# Patient Record
Sex: Male | Born: 1955 | Race: White | Hispanic: No | Marital: Married | Smoking: Current every day smoker
Health system: Southern US, Community
[De-identification: ages and names within clinical notes are randomized; demographics above are authoritative.]

## PROBLEM LIST (undated history)

## (undated) DIAGNOSIS — F32A Depression, unspecified: Secondary | ICD-10-CM

## (undated) DIAGNOSIS — L409 Psoriasis, unspecified: Secondary | ICD-10-CM

## (undated) DIAGNOSIS — N400 Enlarged prostate without lower urinary tract symptoms: Secondary | ICD-10-CM

## (undated) DIAGNOSIS — F329 Major depressive disorder, single episode, unspecified: Secondary | ICD-10-CM

## (undated) DIAGNOSIS — F419 Anxiety disorder, unspecified: Secondary | ICD-10-CM

## (undated) DIAGNOSIS — M48061 Spinal stenosis, lumbar region without neurogenic claudication: Secondary | ICD-10-CM

## (undated) DIAGNOSIS — K4090 Unilateral inguinal hernia, without obstruction or gangrene, not specified as recurrent: Secondary | ICD-10-CM

## (undated) DIAGNOSIS — G479 Sleep disorder, unspecified: Secondary | ICD-10-CM

## (undated) HISTORY — DX: Unilateral inguinal hernia, without obstruction or gangrene, not specified as recurrent: K40.90

## (undated) HISTORY — DX: Anxiety disorder, unspecified: F41.9

## (undated) HISTORY — PX: COLONOSCOPY: SHX174

## (undated) HISTORY — PX: KNEE ARTHROSCOPY: SUR90

## (undated) HISTORY — PX: ROTATOR CUFF REPAIR: SHX139

---

## 2012-05-18 ENCOUNTER — Emergency Department (HOSPITAL_BASED_OUTPATIENT_CLINIC_OR_DEPARTMENT_OTHER)
Admission: EM | Admit: 2012-05-18 | Discharge: 2012-05-18 | Disposition: A | Discharge status: Home / Self Care | Payer: BC Managed Care – PPO | Attending: Emergency Medicine | Admitting: Emergency Medicine

## 2012-05-18 DIAGNOSIS — K4090 Unilateral inguinal hernia, without obstruction or gangrene, not specified as recurrent: Secondary | ICD-10-CM

## 2012-05-18 LAB — BASIC METABOLIC PANEL
CO2: 24 mEq/L (ref 19–32)
Calcium: 9.9 mg/dL (ref 8.4–10.5)
GFR calc Af Amer: >90 mL/min (ref >90)
Sodium: 138 mEq/L (ref 135–145)

## 2012-05-18 LAB — DIFFERENTIAL
Basophils Absolute: 0 10*3/uL (ref 0.0–0.1)
Eosinophils Relative: 1 % (ref 0–5)
Lymphocytes Relative: 11 % — ABNORMAL LOW (ref 12–46)
Neutro Abs: 8.8 10*3/uL — ABNORMAL HIGH (ref 1.7–7.7)

## 2012-05-18 LAB — CBC
MCV: 87.3 fL (ref 78.0–100.0)
Platelets: 128 10*3/uL — ABNORMAL LOW (ref 150–400)
RDW: 13.5 % (ref 11.5–15.5)
WBC: 10.8 10*3/uL — ABNORMAL HIGH (ref 4.0–10.5)

## 2012-05-18 LAB — LACTIC ACID, PLASMA: Lactic Acid, Venous: 1.5 mmol/L (ref 0.5–2.2)

## 2012-05-18 MED ORDER — SODIUM CHLORIDE 0.9 % IV BOLUS (SEPSIS)
1000.0000 mL | Freq: Once | INTRAVENOUS | Status: AC
  Administered 2012-05-18: 1000 mL via INTRAVENOUS

## 2012-05-18 MED ORDER — OXYCODONE-ACETAMINOPHEN 5-325 MG PO TABS: ORAL_TABLET | ORAL | Status: DC

## 2012-05-18 MED ORDER — HYDROMORPHONE HCL PF 1 MG/ML IJ SOLN
1.0000 mg | Freq: Once | INTRAMUSCULAR | Status: AC
  Administered 2012-05-18: 1 mg via INTRAVENOUS
  Filled 2012-05-18: qty 1

## 2012-05-18 MED ORDER — MORPHINE SULFATE 4 MG/ML IJ SOLN
4.0000 mg | Freq: Once | INTRAMUSCULAR | Status: AC
  Administered 2012-05-18: 4 mg via INTRAVENOUS
  Filled 2012-05-18: qty 1

## 2012-05-18 MED ORDER — OXYCODONE-ACETAMINOPHEN 5-325 MG PO TABS
2.0000 | ORAL_TABLET | Freq: Once | ORAL | Status: AC
  Administered 2012-05-18: 2 via ORAL
  Filled 2012-05-18: qty 2

## 2012-05-18 MED ORDER — POLYETHYLENE GLYCOL 3350 17 GM/SCOOP PO POWD: 17.0000 g | Freq: Two times a day (BID) | ORAL | Status: DC | PRN

## 2012-05-18 NOTE — ED Provider Notes (Signed)
History     CSN: [id number]  Arrival date & time 05/18/12  1815   First MD Initiated Contact with Patient 05/18/12 1820      Chief Complaint  Patient presents with  . Groin Pain    (Consider location/radiation/quality/duration/timing/severity/associated sxs/prior treatment) HPI Patient is a 55 yo male who presents today complaining of 10 out of 10 left lower abdominal pain. Patient notable tenderness left inguinal region that he noted about 4 hours ago. Patient has been seen recently for similar symptoms. He reports a CT scan was performed at a hospital in [place] where they noted he had small inguinal hernia. Patient had his last bowel movement yesterday. He has had a colonoscopy recently and has noted some difficulty with constipation that is constant has significant straining. Over the past few days though patient has used numerous medications for this and does not feel that he is constipated at all at this point. Patient's bulge in the left inguinal area is tender to palpation. He denies any penile or scrotal pain. He denies any urinary symptoms. Patient denies nausea, vomiting, or fevers. He has not taken any medications for his pain. There are no other associated or modifying factors. No past medical history on file.  No past surgical history on file.  No family history on file.  History  Substance Use Topics  . Smoking status: Not on file  . Smokeless tobacco: Not on file  . Alcohol Use: Not on file      Review of Systems  Constitutional: Negative.   HENT: Negative.   Eyes: Negative.   Respiratory: Negative.   Cardiovascular: Negative.   Gastrointestinal: Positive for abdominal pain.  Genitourinary: Negative.   Musculoskeletal: Negative.   Skin: Negative.   Neurological: Negative.   Hematological: Negative.   Psychiatric/Behavioral: Negative.   All other systems reviewed and are negative.    Allergies  Aleve and Naproxen  Home Medications   Current  Outpatient Rx  Name Route Sig Dispense Refill  . ALPRAZOLAM 1 MG PO TABS Oral Take 1 mg by mouth at bedtime as needed. Patient uses this medication for anxiety.    [name] Oral Take 1 packet by mouth daily as needed. Patient used this medication for pain.    Marland Kitchen CLONAZEPAM 1 MG PO TABS Oral Take 1 mg by mouth 2 (two) times daily as needed.    . GLYBURIDE-METFORMIN 5-500 MG PO TABS Oral Take 1 tablet by mouth daily with breakfast.    . HYDROCODONE-ACETAMINOPHEN 5-500 MG PO TABS Oral Take 1 tablet by mouth every 6 (six) hours as needed. Patient uses this medication for pain.    Marland Kitchen METFORMIN HCL 500 MG PO TABS Oral Take 500 mg by mouth 2 (two) times daily with a meal.    . TAMSULOSIN HCL 0.4 MG PO CAPS Oral Take 0.4 mg by mouth daily.      BP 155/74  Pulse 97  Temp 98 F (36.7 C) (Oral)  Resp 18  Ht 6\' 3"  (1.905 m)  Wt 210 lb (95.255 kg)  BMI 26.25 kg/m2  SpO2 98%  Physical Exam  Nursing note and vitals reviewed. GEN: Well-developed, well-nourished male in no distress HEENT: Atraumatic, normocephalic. Oropharynx clear without erythema EYES: PERRLA BL, no scleral icterus. NECK: Trachea midline, no meningismus CV: regular rate and rhythm. No murmurs, rubs, or gallops PULM: No respiratory distress.  No crackles, wheezes, or rales. GI: soft, non-tender. No guarding, rebound, or tenderness. + bowel sounds  GU: Patient with  left inguinal bulge with palpation of left inguinal hernia on exam. This is worse with Valsalva. I cannot completely reduce this on my exam. No tenderness to palpation or masses appreciated in the scrotum. Normal circumcised male. Neuro: cranial nerves grossly 2-12 intact, no abnormalities of strength or sensation, A and O x 3 MSK: Patient moves all 4 extremities symmetrically, no deformity, edema, or injury noted Skin: No rashes petechiae, purpura, or jaundice Psych: no abnormality of mood   ED Course  Procedures (including critical care time)  Labs  Reviewed  CBC - Abnormal; Notable for the following:    WBC 10.8 (*)     Hemoglobin 17.1 (*)     Platelets 128 (*)     All other components within normal limits  DIFFERENTIAL - Abnormal; Notable for the following:    Neutrophils Relative 82 (*)     Neutro Abs 8.8 (*)     Lymphocytes Relative 11 (*)     All other components within normal limits  BASIC METABOLIC PANEL - Abnormal; Notable for the following:    Glucose, Bld 319 (*)     BUN 25 (*)     All other components within normal limits  LACTIC ACID, PLASMA   No results found.   1. Inguinal hernia       MDM  Patient presents today with left inguinal bulge consistent with hernia based on my exam. Patient had ice applied and was given pain medication. Patient had labs performed. Patient had imaging exam performed recently with CT of abdomen and pelvis with IV contrast at [facility]. Despite our efforts we were not able to obtain this result. Patient told me that it did show inguinal hernias. Patient did not have an elevation of his lactate which would suggest bowel necrosis. His hernia spontaneously reduced with ice and pain control. Patient did receive a dose of a gram of Dilaudid as well. Patient's pain was down to a 4/10. Patient was then given 2 tabs of Percocet by mouth. He was given a prescription for this medication and strongly advised that he would need to continue having regular bowel movements so as not to make his pain worse. Patient was given a prescription for MiraLAX and told to titrate this to a bowel movement at least every 2 days. He was given referral information for [place] [facility].  Patient was advised that if he has this happen again he can try ice and pain medications at home. If he is unable to resolve the bulge that he has at that time he should present to an emergency department for evaluation. Patient was completely reduced at the time of discharge. He was given a note for work.       Cyndra Numbers,  MD 05/18/12 2129

## 2012-05-18 NOTE — ED Notes (Signed)
Pt complaining of left groin pain for 2 months. States pain got worse this afternoon around 4pm and now he feels a knot in his left groin region.

## 2012-05-18 NOTE — ED Notes (Signed)
Pt reports left groin pain for 2-3 months. States pain got severe around 4pm this afternoon and he now feels a "knot" in his left groin region. Pt has tenderness and swelling to the left groin. Pt reports having gone to [facility] ER and was told they "didn't see anything" and that he needed to follow up with PCP for a colonoscoy. Pt then wen to [facility] and was told to come to [facility] for further evaluation. Pt rates pain 8/10.  Pt denies nausea and vomiting at this time.

## 2012-05-22 ENCOUNTER — Telehealth (INDEPENDENT_AMBULATORY_CARE_PROVIDER_SITE_OTHER): Payer: Self-pay | Admitting: General Surgery

## 2012-05-22 ENCOUNTER — Ambulatory Visit (INDEPENDENT_AMBULATORY_CARE_PROVIDER_SITE_OTHER): Payer: BC Managed Care – PPO | Admitting: General Surgery

## 2012-05-22 ENCOUNTER — Encounter (INDEPENDENT_AMBULATORY_CARE_PROVIDER_SITE_OTHER): Payer: Self-pay | Admitting: General Surgery

## 2012-05-22 VITALS — BP 120/76 | HR 98 | Temp 97.8°F | Resp 16 | Ht 75.0 in | Wt 203.0 lb

## 2012-05-22 DIAGNOSIS — K4090 Unilateral inguinal hernia, without obstruction or gangrene, not specified as recurrent: Secondary | ICD-10-CM

## 2012-05-22 MED ORDER — OXYCODONE-ACETAMINOPHEN 5-325 MG PO TABS: ORAL_TABLET | ORAL | Status: DC

## 2012-05-22 NOTE — Telephone Encounter (Signed)
Message copied by Liliana Cline on Tue May 22, 2012  3:05 PM ------      Message from: Cathi Roan      Created: Tue May 22, 2012  2:50 PM       [phone] Needs note faxed to working stating he will be out of work til after surgery. Please fax to [employer] [phone]

## 2012-05-22 NOTE — Progress Notes (Signed)
Patient ID: Derrick [name], male   DOB: 02/04/1956, 56 y.o.   MRN: [id number]  Chief Complaint  Patient presents with  . Inguinal Hernia    new pt- eval [facility]    HPI Derrick [name] is a 56 y.o. male.   HPI This is a 56-year-old Caucasian male referred by Dr. Hunt for evaluation of a left inguinal hernia. The patient states that he has had several months of left lower quadrant pain. He describes it as a sharp burning pain. It also feels like a pressure sensation. He has undergone several evaluations over the past couple months because of the persistent pain. It occurs on a daily basis. He states that he had a CT scan done at [facility] and was told he had a small left inguinal hernia. He also states that he has had a normal colonoscopy as well. This past Friday at work, he noticed a large bulge in his left groin which caused worse pain than usual. He went to the emergency room. It was reduced.he states that he has noticed a small lump in that area prior to Friday however it was the largest on Friday. He states he had another episode yesterday where he had a small lump in his groin but was able to reduce it by laying down. He states he vomited once yesterday. He is also had some nausea. He is passing gas. He denies any bloating or distention. He states that he has chronic irregular bowel movements. He denies any melena or hematochezia. He denies any dysuria. 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 Past Medical History  Diagnosis Date  . Inguinal hernia     left  . Anxiety     Past Surgical History  Procedure Date  . Rotator cuff repair     right x2  . Knee arthroscopy     right knee    Family History  Problem Relation Age of Onset  . Cancer Mother     breast and thyroid  . Cancer Father     prostate    Social History History  Substance Use Topics  . Smoking status: Current Everyday Smoker -- 1.0 packs/day  . Smokeless tobacco: Not on file  . Alcohol Use: No     Allergies  Allergen Reactions  . Aleve (Naproxen Sodium) Hives  . Naproxen Hives    Current Outpatient Prescriptions  Medication Sig Dispense Refill  . ALPRAZolam (XANAX) 1 MG tablet Take 1 mg by mouth at bedtime as needed. Patient uses this medication for anxiety.      . Aspirin-Acetaminophen-Caffeine (GOODY HEADACHE PO) Take 1 packet by mouth daily as needed. Patient used this medication for pain.      . clonazePAM (KLONOPIN) 1 MG tablet Take 1 mg by mouth 2 (two) times daily as needed.      . glyBURIDE-metformin (GLUCOVANCE) 5-500 MG per tablet Take 1 tablet by mouth daily with breakfast.      . HYDROcodone-acetaminophen (VICODIN) 5-500 MG per tablet Take 1 tablet by mouth every 6 (six) hours as needed. Patient uses this medication for pain.      . metFORMIN (GLUCOPHAGE) 500 MG tablet Take 500 mg by mouth 2 (two) times daily with a meal.      . ONE TOUCH ULTRA TEST test strip       . oxyCODONE-acetaminophen (PERCOCET) 5-325 MG per tablet Take 1-2 tabs by mouth every 6 hours as needed for pain.  30 tablet  0  . polyethylene glycol powder (GLYCOLAX/MIRALAX)   powder Take 17 g by mouth 2 (two) times daily as needed.      . Tamsulosin HCl (FLOMAX) 0.4 MG CAPS Take 0.4 mg by mouth daily.        Review of Systems Review of Systems  Constitutional: Positive for activity change. Negative for fever, chills, appetite change and unexpected weight change.  HENT: Negative for congestion and trouble swallowing.   Eyes: Negative for visual disturbance.  Respiratory: Negative for chest tightness and shortness of breath.   Cardiovascular: Negative for chest pain and leg swelling.       No PND, no orthopnea, no DOE  Gastrointestinal:       See HPI  Genitourinary: Negative for dysuria and hematuria.  Musculoskeletal: Negative.   Skin: Negative for rash.  Neurological: Negative for seizures and speech difficulty.  Hematological: Does not bruise/bleed easily.  Psychiatric/Behavioral: Negative  for behavioral problems and confusion.    Blood pressure 120/76, pulse 98, temperature 97.8 F (36.6 C), temperature source Temporal, resp. rate 16, height 6' 3" (1.905 m), weight 203 lb (92.08 kg).  Physical Exam Physical Exam  Vitals reviewed. Constitutional: He is oriented to person, place, and time. He appears well-developed and well-nourished. No distress.  HENT:  Head: Normocephalic and atraumatic.  Right Ear: External ear normal.  Left Ear: External ear normal.  Eyes: Conjunctivae are normal. No scleral icterus.  Neck: Normal range of motion. Neck supple. No tracheal deviation present. No thyromegaly present.  Cardiovascular: Normal rate, regular rhythm and normal heart sounds.   Pulmonary/Chest: Effort normal and breath sounds normal. No respiratory distress. He has no wheezes.       Smokers cough  Abdominal: Soft. Bowel sounds are normal. He exhibits no distension. There is no tenderness. A hernia is present. Hernia confirmed positive in the left inguinal area.  Genitourinary: Testes normal and penis normal. Right testis shows no mass. Left testis shows no mass.       Reducible [facility]  Musculoskeletal: Normal range of motion. He exhibits no edema and no tenderness.  Lymphadenopathy:    He has no cervical adenopathy.  Neurological: He is oriented to person, place, and time. He exhibits normal muscle tone.  Skin: Skin is warm and dry. No rash noted. He is not diaphoretic. No erythema.       Scattered hyperpigmented skin lesions on b/l LE  Psychiatric: He has a normal mood and affect. His behavior is normal. Judgment and thought content normal.    Data Reviewed ED note from 05/18/12  Assessment    Left inguinal hernia    Plan    We discussed the etiology of inguinal hernias. We discussed the signs & symptoms of incarceration & strangulation.  We discussed non-operative and operative management. We discussed both open and laparoscopic repairs. We discussed the pros and cons  of each along with the risk & benefits  The patient has elected to proceed with LAPAROSCOPIC REPAIR OF LEFT INGUINAL HERNIA WITH MESH   I described the procedure in detail.  The patient was given educational material. We discussed the risks and benefits including but not limited to bleeding, infection, chronic inguinal pain, nerve entrapment, hernia recurrence, mesh complications, hematoma formation, urinary retention, injury to the testicles, numbness in the groin, blood clots, injury to the surrounding structures, and anesthesia risk. We also discussed the typical post operative recovery course, including no heavy lifting for 4-6 weeks. I explained that the likelihood of improvement of their symptoms is good. I did explain that he is   at slightly higher risk for infection as well as recurrence given his tobacco use and underlying diabetes mellitus.  Zohan Shiflet M. Braidon Chermak, MD, FACS General, Bariatric, & Minimally Invasive Surgery [facility]         Tou Hayner M 05/22/2012, 5:18 PM    

## 2012-05-22 NOTE — Patient Instructions (Signed)
Hernia Repair with Laparoscope A hernia occurs when an internal organ pushes out through a weak spot in the belly (abdominal) wall muscles. Hernias most commonly occur in the groin and around the navel. Hernias can also occur through a cut by the surgeon (incision) after an abdominal operation. A hernia may be caused by:  Lifting heavy objects.   Prolonged coughing.   Straining to move your bowels.  Hernias can often be pushed back into place (reduced). Most hernias tend to get worse over time. Problems occur when abdominal contents get stuck in the opening and the blood supply is blocked or impaired (incarcerated hernia). Because of these risks, you require surgery to repair the hernia. Your hernia will be repaired using a laparoscope. Laparoscopic surgery is a type of minimally invasive surgery. It does not involve making a typical surgical cut (incision) in the skin. A laparoscope is a telescope-like rod and lens system. It is usually connected to a video camera and a light source so your caregiver can clearly see the operative area. The instruments are inserted through  to  inch (5 mm or 10 mm) openings in the skin at specific locations. A working and viewing space is created by blowing a small amount of carbon dioxide gas into the abdominal cavity. The abdomen is essentially blown up like a balloon (insufflated). This elevates the abdominal wall above the internal organs like a dome. The carbon dioxide gas is common to the human body and can be absorbed by tissue and removed by the respiratory system. Once the repair is completed, the small incisions will be closed with either stitches (sutures) or staples (just like a paper stapler only this staple holds the skin together). LET YOUR CAREGIVERS KNOW ABOUT:  Allergies.   Medications taken including herbs, eye drops, over the counter medications, and creams.   Use of steroids (by mouth or creams).   Previous problems with anesthetics or  Novocaine.   Possibility of pregnancy, if this applies.   History of blood clots (thrombophlebitis).   History of bleeding or blood problems.   Previous surgery.   Other health problems.  BEFORE THE PROCEDURE  Laparoscopy can be done either in a hospital or out-patient clinic. You may be given a mild sedative to help you relax before the procedure. Once in the operating room, you will be given a general anesthesia to make you sleep (unless you and your caregiver choose a different anesthetic).  AFTER THE PROCEDURE  After the procedure you will be watched in a recovery area. Depending on what type of hernia was repaired, you might be admitted to the hospital or you might go home the same day. With this procedure you may have less pain and scarring. This usually results in a quicker recovery and less risk of infection. HOME CARE INSTRUCTIONS   Bed rest is not required. You may continue your normal activities but avoid heavy lifting (more than 10 pounds) or straining.   Cough gently. If you are a smoker it is best to stop, as even the best hernia repair can break down with the continual strain of coughing.   Avoid driving until given the OK by your surgeon.   There are no dietary restrictions unless given otherwise.   TAKE ALL MEDICATIONS AS DIRECTED.   Only take over-the-counter or prescription medicines for pain, discomfort, or fever as directed by your caregiver.  SEEK MEDICAL CARE IF:   There is increasing abdominal pain or pain in your incisions.     There is more bleeding from incisions, other than minimal spotting.   You feel light headed or faint.   You develop an unexplained fever, chills, and/or an oral temperature above 102 F (38.9 C).   You have redness, swelling, or increasing pain in the wound.   Pus coming from wound.   A foul smell coming from the wound or dressings.  SEEK IMMEDIATE MEDICAL CARE IF:   You develop a rash.   You have difficulty breathing.    You have any allergic problems.  MAKE SURE YOU:   Understand these instructions.   Will watch your condition.   Will get help right away if you are not doing well or get worse.  Document Released: 10/31/2005 Document Revised: 10/20/2011 Document Reviewed: 09/30/2009 Exit[employer] Patient Information 2012 [employer]. 

## 2012-05-22 NOTE — Telephone Encounter (Signed)
Ok to keep patient out until after surgery per Dr Andrey Campanile. Note faxed.

## 2012-05-23 ENCOUNTER — Encounter (HOSPITAL_COMMUNITY): Payer: Self-pay | Admitting: Pharmacy Technician

## 2012-05-28 ENCOUNTER — Encounter (HOSPITAL_COMMUNITY): Payer: Self-pay

## 2012-05-28 ENCOUNTER — Ambulatory Visit (HOSPITAL_COMMUNITY)
Admission: RE | Admit: 2012-05-28 | Discharge: 2012-05-28 | Disposition: A | Discharge status: Home / Self Care | Payer: BC Managed Care – PPO | Source: Ambulatory Visit | Attending: General Surgery | Admitting: General Surgery

## 2012-05-28 ENCOUNTER — Encounter (HOSPITAL_COMMUNITY)
Admission: RE | Admit: 2012-05-28 | Discharge: 2012-05-28 | Disposition: A | Discharge status: Home / Self Care | Payer: BC Managed Care – PPO | Source: Ambulatory Visit | Attending: General Surgery | Admitting: General Surgery

## 2012-05-28 DIAGNOSIS — K4090 Unilateral inguinal hernia, without obstruction or gangrene, not specified as recurrent: Secondary | ICD-10-CM | POA: Insufficient documentation

## 2012-05-28 DIAGNOSIS — Z01818 Encounter for other preprocedural examination: Secondary | ICD-10-CM | POA: Insufficient documentation

## 2012-05-28 DIAGNOSIS — Z01812 Encounter for preprocedural laboratory examination: Secondary | ICD-10-CM | POA: Insufficient documentation

## 2012-05-28 LAB — CBC
Hemoglobin: 18.1 g/dL — ABNORMAL HIGH (ref 13.0–17.0)
Platelets: 120 10*3/uL — ABNORMAL LOW (ref 150–400)
RBC: 5.6 MIL/uL (ref 4.22–5.81)
WBC: 12.1 10*3/uL — ABNORMAL HIGH (ref 4.0–10.5)

## 2012-05-28 LAB — DIFFERENTIAL
Lymphocytes Relative: 10 % — ABNORMAL LOW (ref 12–46)
Lymphs Abs: 1.2 10*3/uL (ref 0.7–4.0)
Neutrophils Relative %: 85 % — ABNORMAL HIGH (ref 43–77)

## 2012-05-28 LAB — BASIC METABOLIC PANEL
CO2: 25 mEq/L (ref 19–32)
Calcium: 9.9 mg/dL (ref 8.4–10.5)
GFR calc non Af Amer: >90 mL/min (ref >90)
Potassium: 4.2 mEq/L (ref 3.5–5.1)
Sodium: 136 mEq/L (ref 135–145)

## 2012-05-28 LAB — SURGICAL PCR SCREEN: Staphylococcus aureus: POSITIVE — AB

## 2012-05-28 NOTE — Patient Instructions (Signed)
20 Chett Taniguchi  05/28/2012   Your procedure is scheduled on:  Tuesday 05/29/2012 at 0715 am  Report to [facility] at 0515 AM.  Call this number if you have problems the morning of surgery: [phone]   Remember:   Do not eat food:After Midnight.  May have clear liquids:until Midnight .    Take these medicines the morning of surgery with A SIP OF WATER: Klonopin   Do not wear jewelry  Do not wear lotions, powders, or perfumes.   Do not shave 48 hours prior to surgery. Men may shave face and neck.  Do not bring valuables to the hospital.  Contacts, dentures or bridgework may not be worn into surgery.  Leave suitcase in the car. After surgery it may be brought to your room.  For patients admitted to the hospital, checkout time is 11:00 AM the day of discharge.   Patients discharged the day of surgery will not be allowed to drive home.  Name and phone number of your driver: Ann-spouse [phone]  Special Instructions: CHG Shower Use Special Wash: 1/2 bottle night before surgery and 1/2 bottle morning of surgery.   Please read over the following fact sheets that you were given: MRSA Information, Sleep apnea Sheet, Incentive Spirometry sheet                 If you have any questions, please call me Telford Nab.Georgeanna Lea, RN,BSN at [phone]

## 2012-05-29 ENCOUNTER — Ambulatory Visit (HOSPITAL_COMMUNITY): Payer: BC Managed Care – PPO | Admitting: Anesthesiology

## 2012-05-29 ENCOUNTER — Encounter (HOSPITAL_COMMUNITY): Payer: Self-pay | Admitting: Anesthesiology

## 2012-05-29 ENCOUNTER — Encounter (HOSPITAL_COMMUNITY)
Admission: RE | Disposition: A | Discharge status: Home / Self Care | Payer: Self-pay | Source: Ambulatory Visit | Attending: General Surgery

## 2012-05-29 ENCOUNTER — Encounter (HOSPITAL_COMMUNITY): Payer: Self-pay | Admitting: *Deleted

## 2012-05-29 ENCOUNTER — Ambulatory Visit (HOSPITAL_COMMUNITY)
Admission: RE | Admit: 2012-05-29 | Discharge: 2012-05-29 | Disposition: A | Discharge status: Home / Self Care | Payer: BC Managed Care – PPO | Source: Ambulatory Visit | Attending: General Surgery | Admitting: General Surgery

## 2012-05-29 DIAGNOSIS — E119 Type 2 diabetes mellitus without complications: Secondary | ICD-10-CM | POA: Insufficient documentation

## 2012-05-29 DIAGNOSIS — J410 Simple chronic bronchitis: Secondary | ICD-10-CM | POA: Insufficient documentation

## 2012-05-29 DIAGNOSIS — Z01812 Encounter for preprocedural laboratory examination: Secondary | ICD-10-CM | POA: Insufficient documentation

## 2012-05-29 DIAGNOSIS — K4090 Unilateral inguinal hernia, without obstruction or gangrene, not specified as recurrent: Secondary | ICD-10-CM | POA: Insufficient documentation

## 2012-05-29 DIAGNOSIS — L989 Disorder of the skin and subcutaneous tissue, unspecified: Secondary | ICD-10-CM | POA: Insufficient documentation

## 2012-05-29 DIAGNOSIS — Z79899 Other long term (current) drug therapy: Secondary | ICD-10-CM | POA: Insufficient documentation

## 2012-05-29 HISTORY — PX: INGUINAL HERNIA REPAIR: SHX194

## 2012-05-29 HISTORY — PX: HERNIA REPAIR: SHX51

## 2012-05-29 LAB — GLUCOSE, CAPILLARY: Glucose-Capillary: 219 mg/dL — ABNORMAL HIGH (ref 70–99)

## 2012-05-29 SURGERY — REPAIR, HERNIA, INGUINAL, LAPAROSCOPIC
Anesthesia: General | Site: Abdomen | Laterality: Left | Wound class: Clean

## 2012-05-29 MED ORDER — CEFAZOLIN SODIUM-DEXTROSE 2-3 GM-% IV SOLR
INTRAVENOUS | Status: AC
  Filled 2012-05-29: qty 50

## 2012-05-29 MED ORDER — OXYCODONE HCL 5 MG PO TABS
ORAL_TABLET | ORAL | Status: AC
  Filled 2012-05-29: qty 2

## 2012-05-29 MED ORDER — ONDANSETRON HCL 4 MG/2ML IJ SOLN
INTRAMUSCULAR | Status: DC | PRN
  Administered 2012-05-29: 4 mg via INTRAVENOUS

## 2012-05-29 MED ORDER — ONDANSETRON HCL 4 MG/2ML IJ SOLN: 4.0000 mg | Freq: Four times a day (QID) | INTRAMUSCULAR | Status: DC | PRN

## 2012-05-29 MED ORDER — GLYCOPYRROLATE 0.2 MG/ML IJ SOLN
INTRAMUSCULAR | Status: DC | PRN
  Administered 2012-05-29: .6 mg via INTRAVENOUS

## 2012-05-29 MED ORDER — BUPIVACAINE-EPINEPHRINE PF 0.25-1:200000 % IJ SOLN
INTRAMUSCULAR | Status: AC
  Filled 2012-05-29: qty 30

## 2012-05-29 MED ORDER — LACTATED RINGERS IR SOLN
Status: DC | PRN
  Administered 2012-05-29: 1000 mL

## 2012-05-29 MED ORDER — LIDOCAINE HCL 4 % MT SOLN
OROMUCOSAL | Status: DC | PRN
  Administered 2012-05-29: 4 mL via TOPICAL

## 2012-05-29 MED ORDER — SODIUM CHLORIDE 0.9 % IJ SOLN: 3.0000 mL | INTRAMUSCULAR | Status: DC | PRN

## 2012-05-29 MED ORDER — LACTATED RINGERS IV SOLN
INTRAVENOUS | Status: DC | PRN
  Administered 2012-05-29 (×2): via INTRAVENOUS

## 2012-05-29 MED ORDER — MUPIROCIN 2 % EX OINT
TOPICAL_OINTMENT | CUTANEOUS | Status: AC
  Filled 2012-05-29: qty 22

## 2012-05-29 MED ORDER — SODIUM CHLORIDE 0.9 % IV SOLN: 250.0000 mL | INTRAVENOUS | Status: DC | PRN

## 2012-05-29 MED ORDER — HYDROMORPHONE HCL PF 1 MG/ML IJ SOLN
INTRAMUSCULAR | Status: AC
  Filled 2012-05-29: qty 1

## 2012-05-29 MED ORDER — NEOSTIGMINE METHYLSULFATE 1 MG/ML IJ SOLN
INTRAMUSCULAR | Status: DC | PRN
  Administered 2012-05-29: 4 mg via INTRAVENOUS

## 2012-05-29 MED ORDER — MIDAZOLAM HCL 5 MG/5ML IJ SOLN
INTRAMUSCULAR | Status: DC | PRN
  Administered 2012-05-29: 2 mg via INTRAVENOUS

## 2012-05-29 MED ORDER — OXYCODONE HCL 5 MG PO TABS
5.0000 mg | ORAL_TABLET | ORAL | Status: DC | PRN
  Administered 2012-05-29: 10 mg via ORAL

## 2012-05-29 MED ORDER — CISATRACURIUM BESYLATE (PF) 10 MG/5ML IV SOLN
INTRAVENOUS | Status: DC | PRN
  Administered 2012-05-29: 2 mg via INTRAVENOUS
  Administered 2012-05-29: 8 mg via INTRAVENOUS

## 2012-05-29 MED ORDER — ACETAMINOPHEN 10 MG/ML IV SOLN
INTRAVENOUS | Status: AC
  Filled 2012-05-29: qty 100

## 2012-05-29 MED ORDER — INSULIN ASPART 100 UNIT/ML ~~LOC~~ SOLN
SUBCUTANEOUS | Status: AC
  Filled 2012-05-29: qty 1

## 2012-05-29 MED ORDER — MORPHINE SULFATE 10 MG/ML IJ SOLN: 1.0000 mg | INTRAMUSCULAR | Status: DC | PRN

## 2012-05-29 MED ORDER — ACETAMINOPHEN 650 MG RE SUPP
650.0000 mg | RECTAL | Status: DC | PRN
  Filled 2012-05-29: qty 1

## 2012-05-29 MED ORDER — PROMETHAZINE HCL 25 MG/ML IJ SOLN: 6.2500 mg | INTRAMUSCULAR | Status: DC | PRN

## 2012-05-29 MED ORDER — ALBUTEROL SULFATE HFA 108 (90 BASE) MCG/ACT IN AERS
INHALATION_SPRAY | RESPIRATORY_TRACT | Status: DC | PRN
  Administered 2012-05-29: 7 via RESPIRATORY_TRACT

## 2012-05-29 MED ORDER — SUCCINYLCHOLINE CHLORIDE 20 MG/ML IJ SOLN
INTRAMUSCULAR | Status: DC | PRN
  Administered 2012-05-29: 100 mg via INTRAVENOUS

## 2012-05-29 MED ORDER — SUFENTANIL CITRATE 50 MCG/ML IV SOLN
INTRAVENOUS | Status: DC | PRN
  Administered 2012-05-29 (×2): 10 ug via INTRAVENOUS
  Administered 2012-05-29: 20 ug via INTRAVENOUS
  Administered 2012-05-29: 10 ug via INTRAVENOUS

## 2012-05-29 MED ORDER — MUPIROCIN 2 % EX OINT
TOPICAL_OINTMENT | Freq: Two times a day (BID) | CUTANEOUS | Status: DC
  Administered 2012-05-29: 1 via NASAL

## 2012-05-29 MED ORDER — OXYCODONE-ACETAMINOPHEN 7.5-325 MG PO TABS: 1.0000 | ORAL_TABLET | ORAL | Status: AC | PRN

## 2012-05-29 MED ORDER — ACETAMINOPHEN 325 MG PO TABS: 650.0000 mg | ORAL_TABLET | ORAL | Status: DC | PRN

## 2012-05-29 MED ORDER — SODIUM CHLORIDE 0.9 % IJ SOLN: 3.0000 mL | Freq: Two times a day (BID) | INTRAMUSCULAR | Status: DC

## 2012-05-29 MED ORDER — BUPIVACAINE-EPINEPHRINE 0.25% -1:200000 IJ SOLN
INTRAMUSCULAR | Status: DC | PRN
  Administered 2012-05-29: 30 mL

## 2012-05-29 MED ORDER — CEFAZOLIN SODIUM-DEXTROSE 2-3 GM-% IV SOLR
2.0000 g | INTRAVENOUS | Status: AC
  Administered 2012-05-29: 2 g via INTRAVENOUS

## 2012-05-29 MED ORDER — ACETAMINOPHEN 10 MG/ML IV SOLN
INTRAVENOUS | Status: DC | PRN
  Administered 2012-05-29: 1000 mg via INTRAVENOUS

## 2012-05-29 MED ORDER — INSULIN ASPART 100 UNIT/ML ~~LOC~~ SOLN
5.0000 [IU] | Freq: Once | SUBCUTANEOUS | Status: AC
  Administered 2012-05-29: 5 [IU] via SUBCUTANEOUS

## 2012-05-29 MED ORDER — MUPIROCIN 2 % EX OINT: TOPICAL_OINTMENT | Freq: Two times a day (BID) | CUTANEOUS | Status: AC

## 2012-05-29 MED ORDER — HYDROMORPHONE HCL PF 1 MG/ML IJ SOLN
0.2500 mg | INTRAMUSCULAR | Status: DC | PRN
  Administered 2012-05-29 (×4): 0.5 mg via INTRAVENOUS

## 2012-05-29 MED ORDER — PROPOFOL 10 MG/ML IV EMUL
INTRAVENOUS | Status: DC | PRN
  Administered 2012-05-29: 200 mg via INTRAVENOUS

## 2012-05-29 SURGICAL SUPPLY — 37 items
BANDAGE ADHESIVE 1X3 (GAUZE/BANDAGES/DRESSINGS) IMPLANT
BENZOIN TINCTURE PRP APPL 2/3 (GAUZE/BANDAGES/DRESSINGS) IMPLANT
CANISTER SUCTION 2500CC (MISCELLANEOUS) ×2 IMPLANT
CLOTH BEACON ORANGE TIMEOUT ST (SAFETY) ×2 IMPLANT
DECANTER SPIKE VIAL GLASS SM (MISCELLANEOUS) ×2 IMPLANT
DERMABOND ADVANCED (GAUZE/BANDAGES/DRESSINGS) ×1
DERMABOND ADVANCED .7 DNX12 (GAUZE/BANDAGES/DRESSINGS) ×1 IMPLANT
DEVICE SECURE STRAP 25 ABSORB (INSTRUMENTS) ×2 IMPLANT
DRAPE LAPAROSCOPIC ABDOMINAL (DRAPES) ×2 IMPLANT
DRAPE UTILITY XL STRL (DRAPES) ×2 IMPLANT
DRSG TEGADERM 2-3/8X2-3/4 SM (GAUZE/BANDAGES/DRESSINGS) IMPLANT
DRSG TEGADERM 4X4.75 (GAUZE/BANDAGES/DRESSINGS) IMPLANT
ELECT REM PT RETURN 9FT ADLT (ELECTROSURGICAL) ×2
ELECTRODE REM PT RTRN 9FT ADLT (ELECTROSURGICAL) ×1 IMPLANT
GLOVE BIO SURGEON STRL SZ7.5 (GLOVE) ×2 IMPLANT
GLOVE BIOGEL M STRL SZ7.5 (GLOVE) IMPLANT
GLOVE INDICATOR 8.0 STRL GRN (GLOVE) ×2 IMPLANT
GOWN STRL NON-REIN LRG LVL3 (GOWN DISPOSABLE) ×6 IMPLANT
GOWN STRL REIN XL XLG (GOWN DISPOSABLE) ×4 IMPLANT
KIT BASIN OR (CUSTOM PROCEDURE TRAY) ×2 IMPLANT
MESH ULTRAPRO 3X6 7.6X15CM (Mesh General) ×2 IMPLANT
NS IRRIG 1000ML POUR BTL (IV SOLUTION) ×2 IMPLANT
SCISSORS LAP 5X35 DISP (ENDOMECHANICALS) ×2 IMPLANT
SET IRRIG TUBING LAPAROSCOPIC (IRRIGATION / IRRIGATOR) ×2 IMPLANT
SHEARS CURVED HARMONIC AC 45CM (MISCELLANEOUS) IMPLANT
SLEEVE SURGEON STRL (DRAPES) ×2 IMPLANT
SOLUTION ANTI FOG 6CC (MISCELLANEOUS) ×2 IMPLANT
STRIP CLOSURE SKIN 1/2X4 (GAUZE/BANDAGES/DRESSINGS) IMPLANT
SUT MNCRL AB 4-0 PS2 18 (SUTURE) ×2 IMPLANT
SUT VICRYL 0 UR6 27IN ABS (SUTURE) ×4 IMPLANT
TOWEL OR 17X26 10 PK STRL BLUE (TOWEL DISPOSABLE) ×2 IMPLANT
TOWEL OR NON WOVEN STRL DISP B (DISPOSABLE) ×2 IMPLANT
TRAY FOLEY CATH 14FRSI W/METER (CATHETERS) ×2 IMPLANT
TRAY LAP CHOLE (CUSTOM PROCEDURE TRAY) ×2 IMPLANT
TROCAR BLADELESS OPT 5 75 (ENDOMECHANICALS) ×4 IMPLANT
TROCAR XCEL BLUNT TIP 100MML (ENDOMECHANICALS) ×2 IMPLANT
TUBING INSUFFLATION 10FT LAP (TUBING) ×2 IMPLANT

## 2012-05-29 NOTE — Interval H&P Note (Signed)
History and Physical Interval Note:  05/29/2012 7:07 AM  Artis Delay  has presented today for surgery, with the diagnosis of left ingunial hernia  The various methods of treatment have been discussed with the patient and family. After consideration of risks, benefits and other options for treatment, the patient has consented to  Procedure(s) (LRB): LAPAROSCOPIC INGUINAL HERNIA (Left) INSERTION OF MESH (Left) as a surgical intervention .  The patient's history has been reviewed, patient examined, no change in status, stable for surgery.  I have reviewed the patients' chart and labs.  Questions were answered to the patient's satisfaction.     Mary Sella. Andrey Campanile, MD, FACS General, Bariatric, & Minimally Invasive Surgery [facility] [place]  [facility] M

## 2012-05-29 NOTE — Anesthesia Postprocedure Evaluation (Signed)
  Anesthesia Post-op Note  Patient: Derrick [name]  Procedure(s) Performed: Procedure(s) (LRB): LAPAROSCOPIC INGUINAL HERNIA (Left) INSERTION OF MESH (Left)  Patient Location: PACU  Anesthesia Type: General  Level of Consciousness: awake and alert   Airway and Oxygen Therapy: Patient Spontanous Breathing  Post-op Pain: mild  Post-op Assessment: Post-op Vital signs reviewed, Patient's Cardiovascular Status Stable, Respiratory Function Stable, Patent Airway and No signs of Nausea or vomiting  Post-op Vital Signs: stable  Complications: No apparent anesthesia complications

## 2012-05-29 NOTE — Op Note (Signed)
05/29/2012  Derrick [name] 21-Feb-1956   PREOPERATIVE DIAGNOSIS: left inguinal hernia.   POSTOPERATIVE DIAGNOSIS: left indirect inguinal hernia.   PROCEDURE: Laparoscopic repair of left indirect inguinal hernia with  mesh (TAPP).   SURGEON: Mary Sella. Andrey Campanile, MD   ASSISTANT SURGEON: None.   ANESTHESIA: General plus local consisting of 0.25% Marcaine with epi.   ESTIMATED BLOOD LOSS: Minimal.   FINDINGS: The patient had a left indirect inguinal hernia.  It was repaired using a 3 inch x 6  inch piece of Ethicon UltraPro mesh.   SPECIMEN: none  INDICATIONS FOR PROCEDURE: 56 yo WM with several month history of left groin burning and stinging with a symptomatic inguinal hernia The risks and benefits including but not limited to bleeding, infection, chronic inguinal pain, nerve entrapment, hernia recurrence, mesh complications, hematoma formation, urinary retention, injury to the testicles or the ovaries, numbness in the groin, blood clots, injury to the surrounding structures, and anesthesia risk was discussed with the patient.  DESCRIPTION OF PROCEDURE: After obtaining verbal consent and marking  the left groin in the holding area with the patient confirming the  operative site, the patient was then taken back to the operating room, placed  supine on the operating room table. General endotracheal anesthesia was  established. The patient had emptied their bladder prior to going back to  the operating room. Sequential compression devices were placed. The  abdomen and groin were prepped and draped in the usual standard surgical  fashion with ChloraPrep. The patient received IV Tylenol as well as IV  antibiotics prior to the incision. A surgical time-out was performed.  Local was infiltrated at the base of the umbilicus.   Next, a 1-cm vertical infraumbilical incision was made with a #11 blade. The fascia  was grasped and lifted anteriorly. Next, the fascia was incised, and  the  abdominal cavity was entered. Pursestring suture was placed around  the fascial edges using a 0 Vicryl. A 12-mm Hasson trocar was placed.  Pneumoperitoneum was smoothly established up to a patient pressure of 15  mmHg. Laparoscope was advanced. There was no evidence of a  contralateral hernia. The patient had a defect lateral to  the inferior epigastric vessel, consistent with an left indirect  hernia. Two 5-mm trocars were placed, one on the right, one on the left  in the midclavicular line slightly above the level of the umbilicus all  under direct visualization. After local had been infiltrated, I then  made incision along the peritoneum on the left, starting 2 inches above  the anterior superior iliac spine and caring it medial  toward the median umbilical ligament in a lazy S configuration using  Endo Shears with electrocautery. The peritoneal flap was then gently  dissected downward from the anterior abdominal wall taking care not to  injure the inferior epigastric vessels. The pubic bone was identified.  The testicular vessels were identified.  Using  traction and counter traction with short graspers, I reduced the sac in  its entirety. Interestingly, there was a sigmoid colon epiploic appendage adhered down into the sac. This was freed with endoshears. The testicular vessels had been identified and preserved. The vas deferens was identified and preserved, and the hernia sac was stripped from those to  surrounding structures. The sac was tethered to the cord structures in 1 place but I was able to free it.  I then went about creating a large pocket by  lifting the peritoneum of the pelvic floor. I took great care  not to  injure the iliac vessels.    Local anesthetic was injected 2 finger breadths below and medial to the anterior superior iliac spine as well as along the left groin prior to placing the mesh. I then obtained a piece of Ethicon UltraPro mesh 3 inch x  6 inch, placed it  through the Hasson trocar, half of it covered medial  to the inferior epigastric vessels and half of it lateral to the  inferior epigastric vessels. The defect was well  covered with the mesh. I then secured the mesh to the abdominal wall  using an Ethicon secure strap tack. 2 Tacks were placed through  the Cooper's ligament, one tack on each side of the inferior epigastric  vessel and 1 tack out laterally. No tacks were placed below the  shelving edge of the inguinal ligament. Pneumoperitoneum was reduced  to 8 mmHg. I then brought the peritoneal flap back up to the abdominal  wall and tacked it to the abdominal wall using 4 tacks. There was no  defect in the peritoneum, and the mesh was well covered. I removed the  Hasson trocar and tied down the previously placed pursestring suture.  The closure was viewed laparoscopically. There was no evidence of  fascial defect. There was no air leak at the umbilicus. There was no  evidence of injury to surrounding structures. Pneumoperitoneum was  released, and the remaining trocars were removed. All skin incisions  were closed with a 4-0 Monocryl in a subcuticular fashion followed by  application of Dermabond. All needle, instrument, and sponge counts  were correct x2. There are no immediate complications. The patient  tolerated the procedure well. The patient was extubated and taken to the  recovery room in stable condition.  Mary Sella. Andrey Campanile, MD, FACS General, Bariatric, & Minimally Invasive Surgery [facility], [place]

## 2012-05-29 NOTE — Preoperative (Signed)
Beta Blockers   Reason not to administer Beta Blockers:Not Applicable 

## 2012-05-29 NOTE — Anesthesia Procedure Notes (Signed)
Procedure Name: Intubation Date/Time: 05/29/2012 7:24 AM Performed by: Leroy Libman L Patient Re-evaluated:Patient Re-evaluated prior to inductionOxygen Delivery Method: Circle system utilized Preoxygenation: Pre-oxygenation with 100% oxygen Intubation Type: IV induction Ventilation: Mask ventilation without difficulty and Oral airway inserted - appropriate to patient size Laryngoscope Size: Miller and 3 Grade View: Grade II Tube type: Oral Tube size: 8.0 mm Number of attempts: 1 Airway Equipment and Method: Stylet and LTA kit utilized Placement Confirmation: ETT inserted through vocal cords under direct vision,  breath sounds checked- equal and bilateral and positive ETCO2 Secured at: 22 cm Tube secured with: Tape Dental Injury: Teeth and Oropharynx as per pre-operative assessment

## 2012-05-29 NOTE — Progress Notes (Signed)
Notified Dr. Acey Lav of CBG 219 and also informed pt takes glucovance twice daily and it was last taken 05/28/12 at 2000.  Dr. Acey Lav gave no new orders.

## 2012-05-29 NOTE — Anesthesia Preprocedure Evaluation (Signed)
Anesthesia Evaluation  Patient identified by MRN, date of birth, ID band Patient awake    Reviewed: Allergy & Precautions, H&P , NPO status , Patient's Chart, lab work & pertinent test results  Airway Mallampati: II TM Distance: <3 FB Neck ROM: Full    Dental No notable dental hx.    Pulmonary Current Smoker,  breath sounds clear to auscultation  Pulmonary exam normal       Cardiovascular negative cardio ROS  Rhythm:Regular Rate:Normal     Neuro/Psych negative neurological ROS  negative psych ROS   GI/Hepatic negative GI ROS, Neg liver ROS,   Endo/Other  Type 2  Renal/GU negative Renal ROS  negative genitourinary   Musculoskeletal negative musculoskeletal ROS (+)   Abdominal   Peds negative pediatric ROS (+)  Hematology negative hematology ROS (+)   Anesthesia Other Findings   Reproductive/Obstetrics negative OB ROS                           Anesthesia Physical Anesthesia Plan  ASA: II  Anesthesia Plan: General   Post-op Pain Management:    Induction: Intravenous  Airway Management Planned: Oral ETT  Additional Equipment:   Intra-op Plan:   Post-operative Plan: Extubation in OR  Informed Consent: I have reviewed the patients History and Physical, chart, labs and discussed the procedure including the risks, benefits and alternatives for the proposed anesthesia with the patient or authorized representative who has indicated his/her understanding and acceptance.   Dental advisory given  Plan Discussed with: CRNA  Anesthesia Plan Comments:         Anesthesia Quick Evaluation

## 2012-05-29 NOTE — H&P (View-Only) (Signed)
Patient ID: Derrick [name], male   DOB: Nov 24, 1955, 56 y.o.   MRN: [id number]  Chief Complaint  Patient presents with  . Inguinal Hernia    new pt- eval [facility]    HPI Derrick [name] is a 56 y.o. male.   HPI This is a 56 year old Caucasian male referred by Dr. Alto Denver for evaluation of a left inguinal hernia. The patient states that he has had several months of left lower quadrant pain. He describes it as a sharp burning pain. It also feels like a pressure sensation. He has undergone several evaluations over the past couple months because of the persistent pain. It occurs on a daily basis. He states that he had a CT scan done at [facility] and was told he had a small left inguinal hernia. He also states that he has had a normal colonoscopy as well. This past Friday at work, he noticed a large bulge in his left groin which caused worse pain than usual. He went to the emergency room. It was reduced.he states that he has noticed a small lump in that area prior to Friday however it was the largest on Friday. He states he had another episode yesterday where he had a small lump in his groin but was able to reduce it by laying down. He states he vomited once yesterday. He is also had some nausea. He is passing gas. He denies any bloating or distention. He states that he has chronic irregular bowel movements. He denies any melena or hematochezia. He denies any dysuria. He works at a [employer]. Past Medical History  Diagnosis Date  . Inguinal hernia     left  . Anxiety     Past Surgical History  Procedure Date  . Rotator cuff repair     right x2  . Knee arthroscopy     right knee    Family History  Problem Relation Age of Onset  . Cancer Mother     breast and thyroid  . Cancer Father     prostate    Social History History  Substance Use Topics  . Smoking status: Current Everyday Smoker -- 1.0 packs/day  . Smokeless tobacco: Not on file  . Alcohol Use: No     Allergies  Allergen Reactions  . Aleve (Naproxen Sodium) Hives  . Naproxen Hives    Current Outpatient Prescriptions  Medication Sig Dispense Refill  . ALPRAZolam (XANAX) 1 MG tablet Take 1 mg by mouth at bedtime as needed. Patient uses this medication for anxiety.      . Aspirin-Acetaminophen-Caffeine (GOODY HEADACHE PO) Take 1 packet by mouth daily as needed. Patient used this medication for pain.      . clonazePAM (KLONOPIN) 1 MG tablet Take 1 mg by mouth 2 (two) times daily as needed.      . glyBURIDE-metformin (GLUCOVANCE) 5-500 MG per tablet Take 1 tablet by mouth daily with breakfast.      . HYDROcodone-acetaminophen (VICODIN) 5-500 MG per tablet Take 1 tablet by mouth every 6 (six) hours as needed. Patient uses this medication for pain.      . metFORMIN (GLUCOPHAGE) 500 MG tablet Take 500 mg by mouth 2 (two) times daily with a meal.      . ONE TOUCH ULTRA TEST test strip       . oxyCODONE-acetaminophen (PERCOCET) 5-325 MG per tablet Take 1-2 tabs by mouth every 6 hours as needed for pain.  30 tablet  0  . polyethylene glycol powder (GLYCOLAX/MIRALAX)  powder Take 17 g by mouth 2 (two) times daily as needed.      . Tamsulosin HCl (FLOMAX) 0.4 MG CAPS Take 0.4 mg by mouth daily.        Review of Systems Review of Systems  Constitutional: Positive for activity change. Negative for fever, chills, appetite change and unexpected weight change.  HENT: Negative for congestion and trouble swallowing.   Eyes: Negative for visual disturbance.  Respiratory: Negative for chest tightness and shortness of breath.   Cardiovascular: Negative for chest pain and leg swelling.       No PND, no orthopnea, no DOE  Gastrointestinal:       See HPI  Genitourinary: Negative for dysuria and hematuria.  Musculoskeletal: Negative.   Skin: Negative for rash.  Neurological: Negative for seizures and speech difficulty.  Hematological: Does not bruise/bleed easily.  Psychiatric/Behavioral: Negative  for behavioral problems and confusion.    Blood pressure 120/76, pulse 98, temperature 97.8 F (36.6 C), temperature source Temporal, resp. rate 16, height 6\' 3"  (1.905 m), weight 203 lb (92.08 kg).  Physical Exam Physical Exam  Vitals reviewed. Constitutional: He is oriented to person, place, and time. He appears well-developed and well-nourished. No distress.  HENT:  Head: Normocephalic and atraumatic.  Right Ear: External ear normal.  Left Ear: External ear normal.  Eyes: Conjunctivae are normal. No scleral icterus.  Neck: Normal range of motion. Neck supple. No tracheal deviation present. No thyromegaly present.  Cardiovascular: Normal rate, regular rhythm and normal heart sounds.   Pulmonary/Chest: Effort normal and breath sounds normal. No respiratory distress. He has no wheezes.       Smokers cough  Abdominal: Soft. Bowel sounds are normal. He exhibits no distension. There is no tenderness. A hernia is present. Hernia confirmed positive in the left inguinal area.  Genitourinary: Testes normal and penis normal. Right testis shows no mass. Left testis shows no mass.       Reducible LIH  Musculoskeletal: Normal range of motion. He exhibits no edema and no tenderness.  Lymphadenopathy:    He has no cervical adenopathy.  Neurological: He is oriented to person, place, and time. He exhibits normal muscle tone.  Skin: Skin is warm and dry. No rash noted. He is not diaphoretic. No erythema.       Scattered hyperpigmented skin lesions on b/l LE  Psychiatric: He has a normal mood and affect. His behavior is normal. Judgment and thought content normal.    Data Reviewed ED note from 05/18/12  Assessment    Left inguinal hernia    Plan    We discussed the etiology of inguinal hernias. We discussed the signs & symptoms of incarceration & strangulation.  We discussed non-operative and operative management. We discussed both open and laparoscopic repairs. We discussed the pros and cons  of each along with the risk & benefits  The patient has elected to proceed with LAPAROSCOPIC REPAIR OF LEFT INGUINAL HERNIA WITH MESH   I described the procedure in detail.  The patient was given educational material. We discussed the risks and benefits including but not limited to bleeding, infection, chronic inguinal pain, nerve entrapment, hernia recurrence, mesh complications, hematoma formation, urinary retention, injury to the testicles, numbness in the groin, blood clots, injury to the surrounding structures, and anesthesia risk. We also discussed the typical post operative recovery course, including no heavy lifting for 4-6 weeks. I explained that the likelihood of improvement of their symptoms is good. I did explain that he is  at slightly higher risk for infection as well as recurrence given his tobacco use and underlying diabetes mellitus.  Mary Sella. Andrey Campanile, MD, FACS General, Bariatric, & Minimally Invasive Surgery [facility], [place]         [facility] M 05/22/2012, 5:18 PM

## 2012-05-29 NOTE — Transfer of Care (Signed)
Immediate Anesthesia Transfer of Care Note  Patient: Derrick [name]  Procedure(s) Performed: Procedure(s) (LRB): LAPAROSCOPIC INGUINAL HERNIA (Left) INSERTION OF MESH (Left)  Patient Location: PACU  Anesthesia Type: General  Level of Consciousness: awake, alert  and oriented  Airway & Oxygen Therapy: Patient Spontanous Breathing and Patient connected to face mask oxygen  Post-op Assessment: Report given to PACU RN and Post -op Vital signs reviewed and stable  Post vital signs: Reviewed and stable  Complications: No apparent anesthesia complications

## 2012-05-30 ENCOUNTER — Encounter (HOSPITAL_COMMUNITY): Payer: Self-pay | Admitting: General Surgery

## 2012-06-04 ENCOUNTER — Telehealth (INDEPENDENT_AMBULATORY_CARE_PROVIDER_SITE_OTHER): Payer: Self-pay | Admitting: General Surgery

## 2012-06-04 NOTE — Telephone Encounter (Signed)
Pt's wife calling about swelling and discoloration following hernia surgery.  She states his scrotum and penis are "black and blue" and extremely swollen.  Also, he needs more pain medication.  Reassured wife that bruising and swelling are not unexpected.  Use ice packs, and elevate scrotum whenever he is sitting.  Use folded towel or small pillow to elevate.  Called in Hydrocodone 5/325 mg, # 30, 1-2 po Q 4-6 H prn pain, no refill (per standing orders) to [place], [place]:  [phone].

## 2012-06-13 ENCOUNTER — Encounter (INDEPENDENT_AMBULATORY_CARE_PROVIDER_SITE_OTHER): Payer: Self-pay | Admitting: General Surgery

## 2012-06-13 ENCOUNTER — Ambulatory Visit (INDEPENDENT_AMBULATORY_CARE_PROVIDER_SITE_OTHER): Payer: BC Managed Care – PPO | Admitting: General Surgery

## 2012-06-13 VITALS — BP 108/70 | HR 91 | Temp 99.2°F | Ht 75.0 in | Wt 207.0 lb

## 2012-06-13 DIAGNOSIS — Z09 Encounter for follow-up examination after completed treatment for conditions other than malignant neoplasm: Secondary | ICD-10-CM

## 2012-06-13 MED ORDER — OXYCODONE-ACETAMINOPHEN 7.5-325 MG PO TABS: 1.0000 | ORAL_TABLET | ORAL | Status: DC | PRN

## 2012-06-13 NOTE — Patient Instructions (Signed)
You have formed a hematoma (collection of old blood) in the groin after surgery. It will gradually get smaller but may take up to 2 months to go away

## 2012-06-14 NOTE — Progress Notes (Signed)
Subjective:     Patient ID: Derrick [name], male   DOB: March 05, 1956, 56 y.o.   MRN: [id number]  HPI  56 year old Caucasian male comes in for followup after undergoing a laparoscopic left indirect inguinal hernia repair with mesh on July 16. He states overall he is doing well. He denies any fevers or chills. He denies any nausea or vomiting. He reports daily bowel movements. He is still having some discomfort in his scrotal area. He states he developed swelling and bruising in his lower inguinal area  And  scrotal area. He denies any trouble urinating  Review of Systems     Objective:   Physical Exam BP 108/70  Pulse 91  Temp 99.2 F (37.3 C) (Temporal)  Ht 6\' 3"  (1.905 m)  Wt 207 lb (93.895 kg)  BMI 25.87 kg/m2  SpO2 95% Caucasian male appearing older than stated age in no apparent distress Abdomen-soft, nontender, nondistended. Well-healed trocar incisions GU-fair amount of bruising in the suprapubic area along with the left  And right scrotum.  he has a palpable hematoma in the left inguinal space extending into the upper scrotum. Both testicles are descended    Assessment:     Status post laparoscopic repair of left indirect inguinal hernia with mesh with postoperative hematoma    Plan:     I advised him he should continue to avoid from strenuous activity for an additional 4-5 weeks. I advised him he had developed a postoperative hematoma. We discussed that this is a possibility preop. I advised him that it should go down with time; however, it may take 2-3 months for it to completely resolve. He is not having any numbness or tingling in his groin. A followup 6-8 weeks was scheduled  Mary Sella. Andrey Campanile, MD, FACS General, Bariatric, & Minimally Invasive Surgery [facility], [place]

## 2012-06-28 ENCOUNTER — Encounter (INDEPENDENT_AMBULATORY_CARE_PROVIDER_SITE_OTHER): Payer: Self-pay | Admitting: General Surgery

## 2012-06-28 ENCOUNTER — Telehealth (INDEPENDENT_AMBULATORY_CARE_PROVIDER_SITE_OTHER): Payer: Self-pay | Admitting: General Surgery

## 2012-06-28 NOTE — Telephone Encounter (Signed)
Message copied by Liliana Cline on Thu Jun 28, 2012  9:52 AM ------      Message from: Zacarias Pontes      Created: Thu Jun 28, 2012  9:41 AM       PT NEEDS ANOTHER RTW FORM FOR HIS JOB.[employer] DOESN'T UNDERSTAND ONE THAT WAS SENT.PT SAID THAT IT NEEDS TO READ NO RESTRICTION AFTER 9/4????? THANKS JANICE PLS CALL HIM AT [phone]

## 2012-06-28 NOTE — Telephone Encounter (Signed)
Spoke with patient. New note written and faxed to his HR department at [phone].

## 2012-07-11 ENCOUNTER — Encounter (INDEPENDENT_AMBULATORY_CARE_PROVIDER_SITE_OTHER): Payer: Self-pay | Admitting: General Surgery

## 2012-07-11 ENCOUNTER — Telehealth (INDEPENDENT_AMBULATORY_CARE_PROVIDER_SITE_OTHER): Payer: Self-pay | Admitting: General Surgery

## 2012-07-11 NOTE — Telephone Encounter (Signed)
Patient requested extended time out of work until 08/01/12. Per Dr Andrey Campanile this is okay to extend out of work time. Note written. Faxed to employer per patient request. [facility] making patient aware this was sent.

## 2012-07-31 ENCOUNTER — Encounter (INDEPENDENT_AMBULATORY_CARE_PROVIDER_SITE_OTHER): Payer: BC Managed Care – PPO | Admitting: General Surgery

## 2013-03-07 NOTE — Progress Notes (Signed)
Need orders for 03-14-13 surgery, pre op is 03-13-13, thanks

## 2013-03-08 ENCOUNTER — Encounter (HOSPITAL_COMMUNITY): Payer: Self-pay | Admitting: Pharmacy Technician

## 2013-03-08 ENCOUNTER — Other Ambulatory Visit: Payer: Self-pay | Admitting: Orthopedic Surgery

## 2013-03-08 NOTE — Progress Notes (Signed)
Dr Shelle Iron-  Please add PRE OP ORDERS- has appt PST 03/13/13  THANK YOU

## 2013-03-13 ENCOUNTER — Ambulatory Visit (HOSPITAL_COMMUNITY)
Admission: RE | Admit: 2013-03-13 | Discharge: 2013-03-13 | Disposition: A | Discharge status: Home / Self Care | Payer: Worker's Compensation | Source: Ambulatory Visit | Attending: Orthopedic Surgery | Admitting: Orthopedic Surgery

## 2013-03-13 ENCOUNTER — Other Ambulatory Visit: Payer: Self-pay | Admitting: Orthopedic Surgery

## 2013-03-13 ENCOUNTER — Encounter (HOSPITAL_COMMUNITY): Payer: Self-pay

## 2013-03-13 ENCOUNTER — Encounter (HOSPITAL_COMMUNITY)
Admission: RE | Admit: 2013-03-13 | Discharge: 2013-03-13 | Disposition: A | Discharge status: Home / Self Care | Payer: Worker's Compensation | Source: Ambulatory Visit | Attending: Specialist | Admitting: Specialist

## 2013-03-13 DIAGNOSIS — Z01818 Encounter for other preprocedural examination: Secondary | ICD-10-CM | POA: Insufficient documentation

## 2013-03-13 DIAGNOSIS — M5137 Other intervertebral disc degeneration, lumbosacral region: Secondary | ICD-10-CM | POA: Insufficient documentation

## 2013-03-13 DIAGNOSIS — M51379 Other intervertebral disc degeneration, lumbosacral region without mention of lumbar back pain or lower extremity pain: Secondary | ICD-10-CM | POA: Insufficient documentation

## 2013-03-13 HISTORY — DX: Spinal stenosis, lumbar region without neurogenic claudication: M48.061

## 2013-03-13 HISTORY — DX: Psoriasis, unspecified: L40.9

## 2013-03-13 LAB — BASIC METABOLIC PANEL
CO2: 28 mEq/L (ref 19–32)
Chloride: 97 mEq/L (ref 96–112)
Creatinine, Ser: 0.52 mg/dL (ref 0.50–1.35)

## 2013-03-13 LAB — SURGICAL PCR SCREEN: Staphylococcus aureus: NEGATIVE

## 2013-03-13 LAB — CBC
HCT: 44.5 % (ref 39.0–52.0)
MCV: 86.6 fL (ref 78.0–100.0)
RBC: 5.14 MIL/uL (ref 4.22–5.81)
RDW: 12.5 % (ref 11.5–15.5)
WBC: 18.5 10*3/uL — ABNORMAL HIGH (ref 4.0–10.5)

## 2013-03-13 NOTE — H&P (Signed)
Derrick [name] is an 56 y.o. male.   Chief Complaint: back and right leg pain HPI: c/o back and right leg pain x 4.5 months s/p lifting injury at work. He has continued to have right lower extremity radicular pain that radiates occasionally down into his great toe. He was reporting some weakness associated with that. Hehas had his epidural which gave him some temporary relief, but now it has returned (ESI L4-5 right). Symptoms refractory to ESI, activity restrictions, opioid and non-opioid analgesics, NSAIDs, steroids,  Past Medical History  Diagnosis Date  . Diabetes mellitus   . Spinal stenosis, lumbar   . Psoriasis     Past Surgical History  Procedure Laterality Date  . Rotator cuff repair      right x2  . Knee arthroscopy      right knee  . Inguinal hernia repair  05/29/2012    Procedure: LAPAROSCOPIC INGUINAL HERNIA;  Surgeon: Eric M Wilson, MD,FACS;  Location: [facility];  Service: General;  Laterality: Left;  . Hernia repair  05/29/12    LIH    Family History  Problem Relation Age of Onset  . Cancer Mother     breast and thyroid  . Cancer Father     prostate   Social History:  reports that he has been smoking Cigarettes.  He has a 30 pack-year smoking history. He does not have any smokeless tobacco history on file. He reports that he does not drink alcohol or use illicit drugs.  Allergies:  Allergies  Allergen Reactions  . Aleve (Naproxen Sodium) Hives  . Naproxen Hives     (Not in a hospital admission)  Results for orders placed during the hospital encounter of 03/13/13 (from the past 48 hour(s))  SURGICAL PCR SCREEN     Status: None   Collection Time    03/13/13 12:58 PM      Result Value Range   MRSA, PCR NEGATIVE  NEGATIVE   Staphylococcus aureus NEGATIVE  NEGATIVE   Comment:            The Xpert SA Assay (FDA     approved for NASAL specimens     in patients over 21 years of age),     is one component of     a comprehensive surveillance     program.   Test performance has     been validated by [facility] for patients greater     than or equal to 1 year old.     It is not intended     to diagnose infection nor to     guide or monitor treatment.  BASIC METABOLIC PANEL     Status: Abnormal   Collection Time    03/13/13  1:30 PM      Result Value Range   Sodium 138  135 - 145 mEq/L   Potassium 4.2  3.5 - 5.1 mEq/L   Chloride 97  96 - 112 mEq/L   CO2 28  19 - 32 mEq/L   Glucose, Bld 286 (*) 70 - 99 mg/dL   BUN 14  6 - 23 mg/dL   Creatinine, Ser 0.52  0.50 - 1.35 mg/dL   Calcium 9.9  8.4 - 10.5 mg/dL   GFR calc non Af Amer >90  >90 mL/min   GFR calc Af Amer >90  >90 mL/min   Comment:            The eGFR has been calculated       using the CKD EPI equation.     This calculation has not been     validated in all clinical     situations.     eGFR's persistently     <90 mL/min signify     possible Chronic Kidney Disease.  CBC     Status: Abnormal   Collection Time    03/13/13  1:30 PM      Result Value Range   WBC 18.5 (*) 4.0 - 10.5 K/uL   RBC 5.14  4.22 - 5.81 MIL/uL   Hemoglobin 15.6  13.0 - 17.0 g/dL   HCT 44.5  39.0 - 52.0 %   MCV 86.6  78.0 - 100.0 fL   MCH 30.4  26.0 - 34.0 pg   MCHC 35.1  30.0 - 36.0 g/dL   RDW 12.5  11.5 - 15.5 %   Platelets 220  150 - 400 K/uL   Dg Lumbar Spine 2-3 Views  03/13/2013  *RADIOLOGY REPORT*  Clinical Data: Preop for spinal decompression.  LUMBAR SPINE - 2-3 VIEW  Comparison: None.  Findings: Normal alignment of the lumbar vertebral bodies.  Mild to moderate degenerative disc disease and facet disease.  No acute bony findings or destructive bony changes.  There are five non-rib bearing lumbar type vertebral bodies.  SI joint degenerative changes are noted.  IMPRESSION: Normal alignment and no acute bony findings Mild multilevel disc disease and facet disease.   Original Report Authenticated By: P. Gallerani, M.D.     Review of Systems  Constitutional: Negative.   HENT: Negative.    Eyes: Negative.   Respiratory: Negative.   Cardiovascular: Negative.   Gastrointestinal: Negative.   Genitourinary: Negative.   Musculoskeletal: Positive for back pain.  Skin: Negative.   Neurological: Positive for focal weakness.  Endo/Heme/Allergies: Negative.   Psychiatric/Behavioral: Negative.     There were no vitals taken for this visit. Physical Exam  Constitutional: He is oriented to person, place, and time. He appears well-developed and well-nourished. He appears distressed.  HENT:  Head: Normocephalic and atraumatic.  Eyes: Conjunctivae and EOM are normal. Pupils are equal, round, and reactive to light.  Neck: Normal range of motion. Neck supple.  Cardiovascular: Normal rate and regular rhythm.   Respiratory: Effort normal and breath sounds normal.  GI: Soft. Bowel sounds are normal.  Musculoskeletal:  On exam moderate distress. Mood and affect are appropriate. Walks with antalgic gait. Straight leg raise produces buttock, thigh, and calf pain on the right; minimal buttock pain on the left. EHL is 5-/5.  Lumbar spine exam reveals no evidence of soft tissue swelling, no evidence of soft tissue swelling or deformity or skin ecchymosis. On palpation there is no tenderness of the lumbar spine. No flank pain with percussion. The abdomen is soft and nontender. Nontender over the trochanters. No cellulitis or lymphadenopathy.  Motor is 5/5 including tibialis anterior, plantarflexion, quadriceps and hamstrings. The patient is normoreflexic. There is no Babinski or clonus. Sensory exam is intact to light touch. The patient has good distal pulses. No DVT. No pain and normal range of motion without instability of the hips, knees and ankles.  Neurological: He is alert and oriented to person, place, and time.  Skin: Skin is warm and dry.  Psychiatric: He has a normal mood and affect.     Three view radiographs demonstrate degenerative changes at 4-5 and 5-1 compensatory  scoliosis.  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 Also   facet hypertrophy with lateral recess stenosis. Minimal listhesis at 4-5. No instability flexion and extension.  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 Battiste concerning current pathology, relevant anatomy, and treatment options. At this point in time options are to live with his symptomatology with pain management. I would not recommend any further injections as the first was not therapeutic. On other option would be to perform lumbar decompression at 4-5 on the right with foraminotomy at 4-5, decompression the 4 root. He is four months status post his injury. We discussed the risks and benefits of that, in addition the postoperative course. Overnight in the hospital, two weeks until suture removal, physical therapy up until six weeks, and then at six weeks work conditioning program for four to six weeks to reach maximum medical improvement. Tobacco cessation is imperative prior to the procedure. We discussed in extensive detail and for long term indicating the dilatory side effects of that. He understands and can commit to that.  Dr. Beane discussed risks, complications, alternatives with the pt including but not limited to DVT, PE, infx, bleeding, failure of procedure, need for secondary procedure, CSF leak, dural tear, nerve injury, anesthesia risk, even death. All questions answered, pt desires to proceed.  Plan lumbar decompression L4-5 right  [place] Ice M. for Dr. Beane 03/13/2013, 9:44 PM    

## 2013-03-13 NOTE — Patient Instructions (Addendum)
Derrick [name]  03/13/2013                           YOUR PROCEDURE IS SCHEDULED ON: 03/14/13               PLEASE REPORT TO SHORT STAY CENTER AT : 8:00 AM               CALL THIS NUMBER IF ANY PROBLEMS THE DAY OF SURGERY :               [phone]                      REMEMBER:   Do not eat food or drink liquids AFTER MIDNIGHT   Take these medicines the morning of surgery with A SIP OF WATER: HYDROCODONE IF NEEDED   Do not wear jewelry, make-up   Do not wear lotions, powders, or perfumes.   Do not shave legs or underarms 12 hrs. before surgery (men may shave face)  Do not bring valuables to the hospital.  Contacts, dentures or bridgework may not be worn into surgery.  Leave suitcase in the car. After surgery it may be brought to your room.  For patients admitted to the hospital more than one night, checkout time is 11:00                          The day of discharge.   Patients discharged the day of surgery will not be allowed to drive home                             If going home same day of surgery, must have someone stay with you first                           24 hrs at home and arrange for some one to drive you home from hospital.    Special Instructions:   Please read over the following fact sheets that you were given:               1. MRSA  INFORMATION                      2. [facility] PREPARING FOR SURGERY SHEET               3. INCENTIVE SPIROMETER                                                X_____________________________________________________________________        Failure to follow these instructions may result in cancellation of your surgery

## 2013-03-13 NOTE — Progress Notes (Signed)
Abnormal CBC and CBG routed to Dr Shelle Iron.

## 2013-03-14 ENCOUNTER — Ambulatory Visit (HOSPITAL_COMMUNITY): Payer: Worker's Compensation

## 2013-03-14 ENCOUNTER — Encounter (HOSPITAL_COMMUNITY): Payer: Self-pay | Admitting: Anesthesiology

## 2013-03-14 ENCOUNTER — Ambulatory Visit (HOSPITAL_COMMUNITY)
Admission: RE | Admit: 2013-03-14 | Discharge: 2013-03-16 | Disposition: A | Discharge status: Home / Self Care | Payer: Worker's Compensation | Source: Ambulatory Visit | Attending: Specialist | Admitting: Specialist

## 2013-03-14 ENCOUNTER — Encounter (HOSPITAL_COMMUNITY)
Admission: RE | Disposition: A | Discharge status: Home / Self Care | Payer: Self-pay | Source: Ambulatory Visit | Attending: Specialist

## 2013-03-14 ENCOUNTER — Encounter (HOSPITAL_COMMUNITY): Payer: Self-pay

## 2013-03-14 ENCOUNTER — Ambulatory Visit (HOSPITAL_COMMUNITY): Payer: Worker's Compensation | Admitting: Anesthesiology

## 2013-03-14 DIAGNOSIS — F17200 Nicotine dependence, unspecified, uncomplicated: Secondary | ICD-10-CM | POA: Insufficient documentation

## 2013-03-14 DIAGNOSIS — M48062 Spinal stenosis, lumbar region with neurogenic claudication: Secondary | ICD-10-CM

## 2013-03-14 DIAGNOSIS — Z01812 Encounter for preprocedural laboratory examination: Secondary | ICD-10-CM | POA: Insufficient documentation

## 2013-03-14 DIAGNOSIS — E119 Type 2 diabetes mellitus without complications: Secondary | ICD-10-CM

## 2013-03-14 DIAGNOSIS — L408 Other psoriasis: Secondary | ICD-10-CM | POA: Insufficient documentation

## 2013-03-14 DIAGNOSIS — M5126 Other intervertebral disc displacement, lumbar region: Secondary | ICD-10-CM | POA: Insufficient documentation

## 2013-03-14 HISTORY — PX: LUMBAR LAMINECTOMY/DECOMPRESSION MICRODISCECTOMY: SHX5026

## 2013-03-14 LAB — GLUCOSE, CAPILLARY
Glucose-Capillary: 202 mg/dL — ABNORMAL HIGH (ref 70–99)
Glucose-Capillary: 193 mg/dL — ABNORMAL HIGH (ref 70–99)
Glucose-Capillary: 215 mg/dL — ABNORMAL HIGH (ref 70–99)
Glucose-Capillary: 273 mg/dL — ABNORMAL HIGH (ref 70–99)

## 2013-03-14 SURGERY — LUMBAR LAMINECTOMY/DECOMPRESSION MICRODISCECTOMY 1 LEVEL
Anesthesia: General | Laterality: Right | Wound class: Clean

## 2013-03-14 MED ORDER — HYDROMORPHONE HCL PF 1 MG/ML IJ SOLN
0.5000 mg | INTRAMUSCULAR | Status: DC | PRN
  Administered 2013-03-14 (×3): 1 mg via INTRAVENOUS
  Filled 2013-03-14 (×2): qty 1

## 2013-03-14 MED ORDER — GLYBURIDE 5 MG PO TABS
10.0000 mg | ORAL_TABLET | Freq: Every day | ORAL | Status: DC
  Administered 2013-03-15: 10 mg via ORAL
  Filled 2013-03-14 (×2): qty 2

## 2013-03-14 MED ORDER — ONDANSETRON HCL 4 MG/2ML IJ SOLN: 4.0000 mg | INTRAMUSCULAR | Status: DC | PRN

## 2013-03-14 MED ORDER — OXYCODONE HCL 5 MG PO TABS: 5.0000 mg | ORAL_TABLET | Freq: Once | ORAL | Status: DC | PRN

## 2013-03-14 MED ORDER — ACETAMINOPHEN 325 MG PO TABS: 650.0000 mg | ORAL_TABLET | ORAL | Status: DC | PRN

## 2013-03-14 MED ORDER — LABETALOL HCL 5 MG/ML IV SOLN
INTRAVENOUS | Status: DC | PRN
  Administered 2013-03-14: 5 mg via INTRAVENOUS

## 2013-03-14 MED ORDER — METFORMIN HCL 500 MG PO TABS
500.0000 mg | ORAL_TABLET | Freq: Every day | ORAL | Status: DC
  Administered 2013-03-15 – 2013-03-16 (×2): 500 mg via ORAL
  Filled 2013-03-14 (×3): qty 1

## 2013-03-14 MED ORDER — OXYCODONE-ACETAMINOPHEN 5-325 MG PO TABS: 1.0000 | ORAL_TABLET | ORAL | Status: DC | PRN

## 2013-03-14 MED ORDER — MENTHOL 3 MG MT LOZG: 1.0000 | LOZENGE | OROMUCOSAL | Status: DC | PRN

## 2013-03-14 MED ORDER — SODIUM CHLORIDE 0.9 % IJ SOLN
3.0000 mL | INTRAMUSCULAR | Status: DC | PRN
  Administered 2013-03-15: 3 mL via INTRAVENOUS

## 2013-03-14 MED ORDER — METFORMIN HCL 500 MG PO TABS
1000.0000 mg | ORAL_TABLET | Freq: Every day | ORAL | Status: DC
  Administered 2013-03-15: 1000 mg via ORAL
  Filled 2013-03-14 (×2): qty 2

## 2013-03-14 MED ORDER — METHOCARBAMOL 500 MG PO TABS: 500.0000 mg | ORAL_TABLET | Freq: Three times a day (TID) | ORAL | Status: DC

## 2013-03-14 MED ORDER — OXYCODONE-ACETAMINOPHEN 5-325 MG PO TABS
1.0000 | ORAL_TABLET | ORAL | Status: DC | PRN
  Administered 2013-03-15 – 2013-03-16 (×4): 2 via ORAL
  Filled 2013-03-14 (×4): qty 2

## 2013-03-14 MED ORDER — CEFAZOLIN SODIUM-DEXTROSE 2-3 GM-% IV SOLR
2.0000 g | INTRAVENOUS | Status: AC
  Administered 2013-03-14: 2 g via INTRAVENOUS
  Filled 2013-03-14: qty 50

## 2013-03-14 MED ORDER — HEMOSTATIC AGENTS (NO CHARGE) OPTIME
TOPICAL | Status: DC | PRN
  Administered 2013-03-14: 1 via TOPICAL

## 2013-03-14 MED ORDER — METHOCARBAMOL 500 MG PO TABS
500.0000 mg | ORAL_TABLET | Freq: Four times a day (QID) | ORAL | Status: DC | PRN
  Administered 2013-03-14 – 2013-03-16 (×7): 500 mg via ORAL
  Filled 2013-03-14 (×7): qty 1

## 2013-03-14 MED ORDER — SODIUM CHLORIDE 0.9 % IJ SOLN
3.0000 mL | Freq: Two times a day (BID) | INTRAMUSCULAR | Status: DC
  Administered 2013-03-15: 20:00:00 via INTRAVENOUS
  Administered 2013-03-15: 3 mL via INTRAVENOUS

## 2013-03-14 MED ORDER — GLYCOPYRROLATE 0.2 MG/ML IJ SOLN
INTRAMUSCULAR | Status: DC | PRN
  Administered 2013-03-14: .7 mg via INTRAVENOUS

## 2013-03-14 MED ORDER — SODIUM CHLORIDE 0.9 % IV SOLN: 250.0000 mL | INTRAVENOUS | Status: DC

## 2013-03-14 MED ORDER — ROCURONIUM BROMIDE 100 MG/10ML IV SOLN
INTRAVENOUS | Status: DC | PRN
  Administered 2013-03-14: 10 mg via INTRAVENOUS
  Administered 2013-03-14: 50 mg via INTRAVENOUS
  Administered 2013-03-14 (×3): 10 mg via INTRAVENOUS
  Administered 2013-03-14: 20 mg via INTRAVENOUS
  Administered 2013-03-14: 10 mg via INTRAVENOUS

## 2013-03-14 MED ORDER — LACTATED RINGERS IV SOLN
INTRAVENOUS | Status: DC
  Administered 2013-03-14: 09:00:00 via INTRAVENOUS

## 2013-03-14 MED ORDER — CEFAZOLIN SODIUM-DEXTROSE 2-3 GM-% IV SOLR
INTRAVENOUS | Status: AC
  Filled 2013-03-14: qty 50

## 2013-03-14 MED ORDER — LIDOCAINE HCL (CARDIAC) 20 MG/ML IV SOLN
INTRAVENOUS | Status: DC | PRN
  Administered 2013-03-14: 50 mg via INTRAVENOUS

## 2013-03-14 MED ORDER — GLYBURIDE-METFORMIN 5-500 MG PO TABS: 1.0000 | ORAL_TABLET | Freq: Two times a day (BID) | ORAL | Status: DC

## 2013-03-14 MED ORDER — SODIUM CHLORIDE 0.45 % IV SOLN
INTRAVENOUS | Status: AC
  Administered 2013-03-14: 15:00:00 via INTRAVENOUS

## 2013-03-14 MED ORDER — DOCUSATE SODIUM 100 MG PO CAPS
100.0000 mg | ORAL_CAPSULE | Freq: Two times a day (BID) | ORAL | Status: DC
  Administered 2013-03-14 – 2013-03-16 (×5): 100 mg via ORAL

## 2013-03-14 MED ORDER — NEOSTIGMINE METHYLSULFATE 1 MG/ML IJ SOLN
INTRAMUSCULAR | Status: DC | PRN
  Administered 2013-03-14: 4 mg via INTRAVENOUS

## 2013-03-14 MED ORDER — ACETAMINOPHEN 650 MG RE SUPP: 650.0000 mg | RECTAL | Status: DC | PRN

## 2013-03-14 MED ORDER — HYDROMORPHONE HCL PF 1 MG/ML IJ SOLN
INTRAMUSCULAR | Status: AC
  Administered 2013-03-14: 15:00:00
  Filled 2013-03-14: qty 1

## 2013-03-14 MED ORDER — SODIUM CHLORIDE 0.9 % IR SOLN
Status: DC | PRN
  Administered 2013-03-14: 11:00:00

## 2013-03-14 MED ORDER — HYDROMORPHONE HCL PF 1 MG/ML IJ SOLN
0.2500 mg | INTRAMUSCULAR | Status: DC | PRN
Start: 2013-03-14 — End: 2013-03-14

## 2013-03-14 MED ORDER — BUPIVACAINE-EPINEPHRINE (PF) 0.5% -1:200000 IJ SOLN
INTRAMUSCULAR | Status: AC
  Filled 2013-03-14: qty 10

## 2013-03-14 MED ORDER — BUPIVACAINE-EPINEPHRINE 0.5% -1:200000 IJ SOLN
INTRAMUSCULAR | Status: DC | PRN
  Administered 2013-03-14: 10 mL

## 2013-03-14 MED ORDER — GLYBURIDE 5 MG PO TABS
5.0000 mg | ORAL_TABLET | Freq: Every day | ORAL | Status: DC
  Administered 2013-03-15 – 2013-03-16 (×2): 5 mg via ORAL
  Filled 2013-03-14 (×3): qty 1

## 2013-03-14 MED ORDER — METHOCARBAMOL 100 MG/ML IJ SOLN: 500.0000 mg | Freq: Four times a day (QID) | INTRAVENOUS | Status: DC | PRN

## 2013-03-14 MED ORDER — CEFAZOLIN SODIUM-DEXTROSE 2-3 GM-% IV SOLR
2.0000 g | Freq: Three times a day (TID) | INTRAVENOUS | Status: AC
  Administered 2013-03-14 – 2013-03-15 (×3): 2 g via INTRAVENOUS
  Filled 2013-03-14 (×3): qty 50

## 2013-03-14 MED ORDER — THROMBIN 5000 UNITS EX SOLR
CUTANEOUS | Status: DC | PRN
  Administered 2013-03-14: 10000 [IU] via TOPICAL

## 2013-03-14 MED ORDER — HYDROCODONE-ACETAMINOPHEN 5-325 MG PO TABS
1.0000 | ORAL_TABLET | ORAL | Status: DC | PRN
  Administered 2013-03-14 – 2013-03-16 (×7): 2 via ORAL
  Filled 2013-03-14 (×7): qty 2

## 2013-03-14 MED ORDER — PROMETHAZINE HCL 25 MG/ML IJ SOLN: 6.2500 mg | INTRAMUSCULAR | Status: DC | PRN

## 2013-03-14 MED ORDER — ACETAMINOPHEN 10 MG/ML IV SOLN: 1000.0000 mg | Freq: Once | INTRAVENOUS | Status: DC | PRN

## 2013-03-14 MED ORDER — OXYCODONE HCL 5 MG/5ML PO SOLN
5.0000 mg | Freq: Once | ORAL | Status: DC | PRN
  Filled 2013-03-14: qty 5

## 2013-03-14 MED ORDER — FENTANYL CITRATE 0.05 MG/ML IJ SOLN
50.0000 ug | INTRAMUSCULAR | Status: DC | PRN
  Administered 2013-03-14: 50 ug via INTRAVENOUS

## 2013-03-14 MED ORDER — FENTANYL CITRATE 0.05 MG/ML IJ SOLN
INTRAMUSCULAR | Status: DC | PRN
  Administered 2013-03-14 (×4): 50 ug via INTRAVENOUS
  Administered 2013-03-14: 100 ug via INTRAVENOUS

## 2013-03-14 MED ORDER — THROMBIN 5000 UNITS EX SOLR
CUTANEOUS | Status: AC
  Filled 2013-03-14: qty 10000

## 2013-03-14 MED ORDER — MIDAZOLAM HCL 5 MG/5ML IJ SOLN
INTRAMUSCULAR | Status: DC | PRN
  Administered 2013-03-14: 2 mg via INTRAVENOUS

## 2013-03-14 MED ORDER — ONDANSETRON HCL 4 MG/2ML IJ SOLN
INTRAMUSCULAR | Status: DC | PRN
  Administered 2013-03-14: 4 mg via INTRAVENOUS

## 2013-03-14 MED ORDER — PHENOL 1.4 % MT LIQD: 1.0000 | OROMUCOSAL | Status: DC | PRN

## 2013-03-14 MED ORDER — MEPERIDINE HCL 50 MG/ML IJ SOLN: 6.2500 mg | INTRAMUSCULAR | Status: DC | PRN

## 2013-03-14 MED ORDER — HYDROMORPHONE HCL PF 1 MG/ML IJ SOLN
INTRAMUSCULAR | Status: DC | PRN
  Administered 2013-03-14 (×3): 1 mg via INTRAVENOUS

## 2013-03-14 MED ORDER — FENTANYL CITRATE 0.05 MG/ML IJ SOLN
INTRAMUSCULAR | Status: AC
  Filled 2013-03-14: qty 2

## 2013-03-14 MED ORDER — CHLORHEXIDINE GLUCONATE 4 % EX LIQD
60.0000 mL | Freq: Once | CUTANEOUS | Status: DC
  Filled 2013-03-14: qty 60

## 2013-03-14 MED ORDER — INSULIN ASPART 100 UNIT/ML ~~LOC~~ SOLN
0.0000 [IU] | Freq: Three times a day (TID) | SUBCUTANEOUS | Status: DC
  Administered 2013-03-14 – 2013-03-15 (×2): 5 [IU] via SUBCUTANEOUS
  Administered 2013-03-15: 3 [IU] via SUBCUTANEOUS
  Administered 2013-03-15 – 2013-03-16 (×2): 5 [IU] via SUBCUTANEOUS

## 2013-03-14 SURGICAL SUPPLY — 50 items
BAG ZIPLOCK 12X15 (MISCELLANEOUS) ×2 IMPLANT
BENZOIN TINCTURE PRP APPL 2/3 (GAUZE/BANDAGES/DRESSINGS) IMPLANT
CHLORAPREP W/TINT 26ML (MISCELLANEOUS) IMPLANT
CLEANER TIP ELECTROSURG 2X2 (MISCELLANEOUS) ×2 IMPLANT
CLOTH 2% CHLOROHEXIDINE 3PK (PERSONAL CARE ITEMS) ×2 IMPLANT
CLOTH BEACON ORANGE TIMEOUT ST (SAFETY) ×2 IMPLANT
DECANTER SPIKE VIAL GLASS SM (MISCELLANEOUS) ×2 IMPLANT
DRAPE MICROSCOPE LEICA (MISCELLANEOUS) ×2 IMPLANT
DRAPE POUCH INSTRU U-SHP 10X18 (DRAPES) ×2 IMPLANT
DRAPE SURG 17X11 SM STRL (DRAPES) ×2 IMPLANT
DRSG AQUACEL AG ADV 3.5X 6 (GAUZE/BANDAGES/DRESSINGS) ×2 IMPLANT
DRSG EMULSION OIL 3X3 NADH (GAUZE/BANDAGES/DRESSINGS) IMPLANT
DRSG PAD ABDOMINAL 8X10 ST (GAUZE/BANDAGES/DRESSINGS) IMPLANT
DRSG TELFA 4X5 ISLAND ADH (GAUZE/BANDAGES/DRESSINGS) IMPLANT
DURAPREP 26ML APPLICATOR (WOUND CARE) ×2 IMPLANT
ELECT BLADE TIP CTD 4 INCH (ELECTRODE) ×2 IMPLANT
ELECT REM PT RETURN 9FT ADLT (ELECTROSURGICAL) ×2
ELECTRODE REM PT RTRN 9FT ADLT (ELECTROSURGICAL) ×1 IMPLANT
GLOVE BIOGEL PI IND STRL 7.5 (GLOVE) ×1 IMPLANT
GLOVE BIOGEL PI IND STRL 8 (GLOVE) ×1 IMPLANT
GLOVE BIOGEL PI INDICATOR 7.5 (GLOVE) ×1
GLOVE BIOGEL PI INDICATOR 8 (GLOVE) ×1
GLOVE SURG SS PI 7.5 STRL IVOR (GLOVE) ×2 IMPLANT
GLOVE SURG SS PI 8.0 STRL IVOR (GLOVE) ×4 IMPLANT
GOWN PREVENTION PLUS LG XLONG (DISPOSABLE) ×2 IMPLANT
GOWN STRL REIN XL XLG (GOWN DISPOSABLE) ×4 IMPLANT
KIT BASIN OR (CUSTOM PROCEDURE TRAY) ×2 IMPLANT
KIT POSITIONING SURG ANDREWS (MISCELLANEOUS) ×2 IMPLANT
MANIFOLD NEPTUNE II (INSTRUMENTS) ×2 IMPLANT
NEEDLE SPNL 18GX3.5 QUINCKE PK (NEEDLE) ×4 IMPLANT
PATTIES SURGICAL .5 X.5 (GAUZE/BANDAGES/DRESSINGS) IMPLANT
PATTIES SURGICAL .75X.75 (GAUZE/BANDAGES/DRESSINGS) IMPLANT
PATTIES SURGICAL 1X1 (DISPOSABLE) IMPLANT
SPONGE SURGIFOAM ABS GEL 100 (HEMOSTASIS) ×2 IMPLANT
STAPLER VISISTAT (STAPLE) ×2 IMPLANT
STRIP CLOSURE SKIN 1/2X4 (GAUZE/BANDAGES/DRESSINGS) IMPLANT
SUT PROLENE 3 0 PS 2 (SUTURE) ×2 IMPLANT
SUT VIC AB 0 CT1 27 (SUTURE)
SUT VIC AB 0 CT1 27XBRD ANTBC (SUTURE) IMPLANT
SUT VIC AB 1 CT1 27 (SUTURE) ×1
SUT VIC AB 1 CT1 27XBRD ANTBC (SUTURE) ×1 IMPLANT
SUT VIC AB 1-0 CT2 27 (SUTURE) ×2 IMPLANT
SUT VIC AB 2-0 CT1 27 (SUTURE) ×1
SUT VIC AB 2-0 CT1 TAPERPNT 27 (SUTURE) ×1 IMPLANT
SUT VIC AB 2-0 CT2 27 (SUTURE) ×2 IMPLANT
SUT VICRYL 0 27 CT2 27 ABS (SUTURE) IMPLANT
SUT VICRYL 0 UR6 27IN ABS (SUTURE) IMPLANT
SYRINGE 10CC LL (SYRINGE) ×2 IMPLANT
TRAY LAMINECTOMY (CUSTOM PROCEDURE TRAY) ×2 IMPLANT
YANKAUER SUCT BULB TIP NO VENT (SUCTIONS) ×2 IMPLANT

## 2013-03-14 NOTE — Interval H&P Note (Signed)
History and Physical Interval Note:  03/14/2013 10:11 AM  Derrick [name]  has presented today for surgery, with the diagnosis of STENOSIS AND H&P L4-5  The various methods of treatment have been discussed with the patient and family. After consideration of risks, benefits and other options for treatment, the patient has consented to  Procedure(s): MICRO LUMBAR DECOMPRESSION L4-5 RIGHT POSSIBLE LEFT (Right) as a surgical intervention .  The patient's history has been reviewed, patient examined, no change in status, stable for surgery.  I have reviewed the patient's chart and labs.  Questions were answered to the patient's satisfaction.     Chelsa Stout C

## 2013-03-14 NOTE — Anesthesia Preprocedure Evaluation (Addendum)
Anesthesia Evaluation  Patient identified by MRN, date of birth, ID band Patient awake    Reviewed: Allergy & Precautions, H&P , NPO status , Patient's Chart, lab work & pertinent test results  Airway Mallampati: II TM Distance: <3 FB Neck ROM: Full    Dental no notable dental hx. (+) Dental Advisory Given   Pulmonary Current Smoker,  breath sounds clear to auscultation  Pulmonary exam normal       Cardiovascular negative cardio ROS  Rhythm:Regular Rate:Normal     Neuro/Psych negative neurological ROS  negative psych ROS   GI/Hepatic negative GI ROS, Neg liver ROS,   Endo/Other  diabetes, Type 2, Oral Hypoglycemic Agents  Renal/GU negative Renal ROS     Musculoskeletal negative musculoskeletal ROS (+)   Abdominal   Peds  Hematology negative hematology ROS (+)   Anesthesia Other Findings   Reproductive/Obstetrics                           Anesthesia Physical  Anesthesia Plan  ASA: II  Anesthesia Plan: General   Post-op Pain Management:    Induction: Intravenous  Airway Management Planned: Oral ETT  Additional Equipment:   Intra-op Plan:   Post-operative Plan: Extubation in OR  Informed Consent: I have reviewed the patients History and Physical, chart, labs and discussed the procedure including the risks, benefits and alternatives for the proposed anesthesia with the patient or authorized representative who has indicated his/her understanding and acceptance.   Dental advisory given  Plan Discussed with: CRNA  Anesthesia Plan Comments:         Anesthesia Quick Evaluation

## 2013-03-14 NOTE — H&P (View-Only) (Signed)
Derrick [name] is an 57 y.o. male.   Chief Complaint: back and right leg pain HPI: c/o back and right leg pain x 4.5 months s/p lifting injury at work. He has continued to have right lower extremity radicular pain that radiates occasionally down into his great toe. He was reporting some weakness associated with that. Hehas had his epidural which gave him some temporary relief, but now it has returned (ESI L4-5 right). Symptoms refractory to ESI, activity restrictions, opioid and non-opioid analgesics, NSAIDs, steroids,  Past Medical History  Diagnosis Date  . Diabetes mellitus   . Spinal stenosis, lumbar   . Psoriasis     Past Surgical History  Procedure Laterality Date  . Rotator cuff repair      right x2  . Knee arthroscopy      right knee  . Inguinal hernia repair  05/29/2012    Procedure: LAPAROSCOPIC INGUINAL HERNIA;  Surgeon: Atilano Ina, MD,FACS;  Location: [facility];  Service: General;  Laterality: Left;  . Hernia repair  05/29/12    LIH    Family History  Problem Relation Age of Onset  . Cancer Mother     breast and thyroid  . Cancer Father     prostate   Social History:  reports that he has been smoking Cigarettes.  He has a 30 pack-year smoking history. He does not have any smokeless tobacco history on file. He reports that he does not drink alcohol or use illicit drugs.  Allergies:  Allergies  Allergen Reactions  . Aleve (Naproxen Sodium) Hives  . Naproxen Hives     (Not in a hospital admission)  Results for orders placed during the hospital encounter of 03/13/13 (from the past 48 hour(s))  SURGICAL PCR SCREEN     Status: None   Collection Time    03/13/13 12:58 PM      Result Value Range   MRSA, PCR NEGATIVE  NEGATIVE   Staphylococcus aureus NEGATIVE  NEGATIVE   Comment:            The Xpert SA Assay (FDA     approved for NASAL specimens     in patients over 93 years of age),     is one component of     a comprehensive surveillance     program.   Test performance has     been validated by [employer] for patients greater     than or equal to 24 year old.     It is not intended     to diagnose infection nor to     guide or monitor treatment.  BASIC METABOLIC PANEL     Status: Abnormal   Collection Time    03/13/13  1:30 PM      Result Value Range   Sodium 138  135 - 145 mEq/L   Potassium 4.2  3.5 - 5.1 mEq/L   Chloride 97  96 - 112 mEq/L   CO2 28  19 - 32 mEq/L   Glucose, Bld 286 (*) 70 - 99 mg/dL   BUN 14  6 - 23 mg/dL   Creatinine, Ser 1.61  0.50 - 1.35 mg/dL   Calcium 9.9  8.4 - 09.6 mg/dL   GFR calc non Af Amer >90  >90 mL/min   GFR calc Af Amer >90  >90 mL/min   Comment:            The eGFR has been calculated  using the CKD EPI equation.     This calculation has not been     validated in all clinical     situations.     eGFR's persistently     <90 mL/min signify     possible Chronic Kidney Disease.  CBC     Status: Abnormal   Collection Time    03/13/13  1:30 PM      Result Value Range   WBC 18.5 (*) 4.0 - 10.5 K/uL   RBC 5.14  4.22 - 5.81 MIL/uL   Hemoglobin 15.6  13.0 - 17.0 g/dL   HCT 16.1  09.6 - 04.5 %   MCV 86.6  78.0 - 100.0 fL   MCH 30.4  26.0 - 34.0 pg   MCHC 35.1  30.0 - 36.0 g/dL   RDW 40.9  81.1 - 91.4 %   Platelets 220  150 - 400 K/uL   Dg Lumbar Spine 2-3 Views  03/13/2013  *RADIOLOGY REPORT*  Clinical Data: Preop for spinal decompression.  LUMBAR SPINE - 2-3 VIEW  Comparison: None.  Findings: Normal alignment of the lumbar vertebral bodies.  Mild to moderate degenerative disc disease and facet disease.  No acute bony findings or destructive bony changes.  There are five non-rib bearing lumbar type vertebral bodies.  SI joint degenerative changes are noted.  IMPRESSION: Normal alignment and no acute bony findings Mild multilevel disc disease and facet disease.   Original Report Authenticated By: Rudie Meyer, JohnstonD.     Review of Systems  Constitutional: Negative.   HENT: Negative.    Eyes: Negative.   Respiratory: Negative.   Cardiovascular: Negative.   Gastrointestinal: Negative.   Genitourinary: Negative.   Musculoskeletal: Positive for back pain.  Skin: Negative.   Neurological: Positive for focal weakness.  Endo/Heme/Allergies: Negative.   Psychiatric/Behavioral: Negative.     There were no vitals taken for this visit. Physical Exam  Constitutional: He is oriented to person, place, and time. He appears well-developed and well-nourished. He appears distressed.  HENT:  Head: Normocephalic and atraumatic.  Eyes: Conjunctivae and EOM are normal. Pupils are equal, round, and reactive to light.  Neck: Normal range of motion. Neck supple.  Cardiovascular: Normal rate and regular rhythm.   Respiratory: Effort normal and breath sounds normal.  GI: Soft. Bowel sounds are normal.  Musculoskeletal:  On exam moderate distress. Mood and affect are appropriate. Walks with antalgic gait. Straight leg raise produces buttock, thigh, and calf pain on the right; minimal buttock pain on the left. EHL is 5-/5.  Lumbar spine exam reveals no evidence of soft tissue swelling, no evidence of soft tissue swelling or deformity or skin ecchymosis. On palpation there is no tenderness of the lumbar spine. No flank pain with percussion. The abdomen is soft and nontender. Nontender over the trochanters. No cellulitis or lymphadenopathy.  Motor is 5/5 including tibialis anterior, plantarflexion, quadriceps and hamstrings. The patient is normoreflexic. There is no Babinski or clonus. Sensory exam is intact to light touch. The patient has good distal pulses. No DVT. No pain and normal range of motion without instability of the hips, knees and ankles.  Neurological: He is alert and oriented to person, place, and time.  Skin: Skin is warm and dry.  Psychiatric: He has a normal mood and affect.     Three view radiographs demonstrate degenerative changes at 4-5 and 5-1 compensatory  scoliosis.  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 Also  facet hypertrophy with lateral recess stenosis. Minimal listhesis at 4-5. No instability flexion and extension.  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 At this point in time options are to live with his symptomatology with pain management. I would not recommend any further injections as the first was not therapeutic. On other option would be to perform lumbar decompression at 4-5 on the right with foraminotomy at 4-5, decompression the 4 root. He is four months status post his injury. We discussed the risks and benefits of that, in addition the postoperative course. Overnight in the hospital, two weeks until suture removal, physical therapy up until six weeks, and then at six weeks work conditioning program for four to six weeks to reach maximum medical improvement. Tobacco cessation is imperative prior to the procedure. We discussed in extensive detail and for long term indicating the dilatory side effects of that. He understands and can commit to that.  Dr. Shelle Iron discussed risks, complications, alternatives with the pt including but not limited to DVT, PE, infx, bleeding, failure of procedure, need for secondary procedure, CSF leak, dural tear, nerve injury, anesthesia risk, even death. All questions answered, pt desires to proceed.  Plan lumbar decompression L4-5 right  Derrick Bailey M. for Dr. Shelle Iron 03/13/2013, 9:44 PM

## 2013-03-14 NOTE — Transfer of Care (Signed)
Immediate Anesthesia Transfer of Care Note  Patient: Derrick [name]  Procedure(s) Performed: Procedure(s) (LRB): MICRO LUMBAR DECOMPRESSION L4-5 RIGHT POSSIBLE LEFT (Right)  Patient Location: PACU  Anesthesia Type: General  Level of Consciousness: sedated, patient cooperative and responds to stimulaton  Airway & Oxygen Therapy: Patient Spontanous Breathing and Patient connected to face mask oxgen  Post-op Assessment: Report given to PACU RN and Post -op Vital signs reviewed and stable  Post vital signs: Reviewed and stable  Complications: No apparent anesthesia complications

## 2013-03-14 NOTE — Anesthesia Postprocedure Evaluation (Signed)
Anesthesia Post Note  Patient: Derrick [name]  Procedure(s) Performed: Procedure(s) (LRB): MICRO LUMBAR DECOMPRESSION L4-5 RIGHT POSSIBLE LEFT (Right)  Anesthesia type: General  Patient location: PACU  Post pain: Pain level controlled  Post assessment: Post-op Vital signs reviewed  Last Vitals: BP 145/80  Pulse 97  Temp(Src) 36.8 C (Oral)  Resp 18  Ht 6\' 3"  (1.905 m)  Wt 198 lb (89.812 kg)  BMI 24.75 kg/m2  SpO2 95%  Post vital signs: Reviewed  Level of consciousness: sedated  Complications: No apparent anesthesia complications

## 2013-03-14 NOTE — Brief Op Note (Signed)
03/14/2013  12:34 PM2  PATIENT:  Derrick Derrick [name]  58 y.o. male  PRE-OPERATIVE DIAGNOSIS:  STENOSIS AND H&P L4-5  POST-OPERATIVE DIAGNOSIS:  STENOSIS AND H&P L4-5  PROCEDURE:  Procedure(s): MICRO LUMBAR DECOMPRESSION L4-5 RIGHT POSSIBLE LEFT (Right)  SURGEON:  Surgeon(s) and Role:    * Javier Docker, MD - Primary  PHYSICIAN ASSISTANT:   ASSISTANTS: Dawayne Cirri ANESTHESIA:   general  EBL:  Total I/O In: 1000 [I.V.:1000] Out: -   BLOOD ADMINISTERED:none  DRAINS: none   LOCAL MEDICATIONS USED:  MARCAINE     SPECIMEN:  No Specimen  DISPOSITION OF SPECIMEN:  N/A  COUNTS:  YES  TOURNIQUET:  * No tourniquets in log *  DICTATION: .Other Dictation: Dictation Number [id number]  PLAN OF CARE: Admit to inpatient   PATIENT DISPOSITION:  PACU - hemodynamically stable.   Derrick [name] start of Pharmacological VTE agent (>24hrs) due to surgical blood loss or risk of bleeding: yes

## 2013-03-15 ENCOUNTER — Encounter (HOSPITAL_COMMUNITY): Payer: Self-pay | Admitting: Specialist

## 2013-03-15 DIAGNOSIS — E119 Type 2 diabetes mellitus without complications: Secondary | ICD-10-CM

## 2013-03-15 LAB — GLUCOSE, CAPILLARY
Glucose-Capillary: 213 mg/dL — ABNORMAL HIGH (ref 70–99)
Glucose-Capillary: 196 mg/dL — ABNORMAL HIGH (ref 70–99)
Glucose-Capillary: 225 mg/dL — ABNORMAL HIGH (ref 70–99)

## 2013-03-15 LAB — CBC
HCT: 38.9 % — ABNORMAL LOW (ref 39.0–52.0)
Hemoglobin: 13.1 g/dL (ref 13.0–17.0)
MCH: 28.8 pg (ref 26.0–34.0)
MCHC: 33.2 g/dL (ref 30.0–36.0)
MCV: 86.8 fL (ref 78.0–100.0)
Platelets: 178 K/uL (ref 150–400)
RBC: 4.48 MIL/uL (ref 4.22–5.81)
RDW: 12.7 % (ref 11.5–15.5)
WBC: 19.3 K/uL — ABNORMAL HIGH (ref 4.0–10.5)

## 2013-03-15 LAB — BASIC METABOLIC PANEL WITH GFR
BUN: 5 mg/dL — ABNORMAL LOW (ref 6–23)
CO2: 26 meq/L (ref 19–32)
Calcium: 8.7 mg/dL (ref 8.4–10.5)
Chloride: 95 meq/L — ABNORMAL LOW (ref 96–112)
Creatinine, Ser: 0.41 mg/dL — ABNORMAL LOW (ref 0.50–1.35)
GFR calc Af Amer: >90 mL/min
GFR calc non Af Amer: >90 mL/min
Glucose, Bld: 234 mg/dL — ABNORMAL HIGH (ref 70–99)
Potassium: 3.5 meq/L (ref 3.5–5.1)
Sodium: 134 meq/L — ABNORMAL LOW (ref 135–145)

## 2013-03-15 MED ORDER — TAMSULOSIN HCL 0.4 MG PO CAPS
0.4000 mg | ORAL_CAPSULE | Freq: Every day | ORAL | Status: DC
  Administered 2013-03-15 – 2013-03-16 (×2): 0.4 mg via ORAL
  Filled 2013-03-15 (×2): qty 1

## 2013-03-15 MED ORDER — GABAPENTIN 100 MG PO CAPS
100.0000 mg | ORAL_CAPSULE | Freq: Two times a day (BID) | ORAL | Status: DC
  Administered 2013-03-15 – 2013-03-16 (×3): 100 mg via ORAL
  Filled 2013-03-15 (×5): qty 1

## 2013-03-15 MED ORDER — GABAPENTIN 300 MG PO CAPS
300.0000 mg | ORAL_CAPSULE | Freq: Every day | ORAL | Status: DC
  Administered 2013-03-15: 300 mg via ORAL
  Filled 2013-03-15 (×2): qty 1

## 2013-03-15 MED ORDER — GABAPENTIN 100 MG PO CAPS: ORAL_CAPSULE | ORAL | Status: DC

## 2013-03-15 NOTE — Progress Notes (Signed)
CSW consulted for SNF placement. PN reviewed. Pt plans to d/c home later today or tomorrow am, depending on progress. RNCM will assist with d/c planning needs. CSW will assist if plan changes and SNF placement is needed.  Cori Razor LCSW [phone]

## 2013-03-15 NOTE — Care Management Note (Signed)
    Page 1 of 2   03/15/2013     3:15:59 PM   CARE MANAGEMENT NOTE 03/15/2013  Patient:  Derrick [name], Derrick [name]   Account Number:  [id number]  Date Initiated:  03/15/2013  Documentation initiated by:  Derrick [name]  Subjective/Objective Assessment:   dx spinal stenosis. HNP L4-5; lumbar decompression  L4-5    Worker's comp claim #[id number]  DOI-10/29/2012--Derrick [name] [employer]  Worker's comp CM-Derrick [name]     Action/Plan:   CM spoke with patient and spouse. Plans are for pt to return to his home in [facility] where his spouse will be caregiver. He owns standard walker   Anticipated DC Date:  03/15/2013   Anticipated DC Plan:  HOME/SELF CARE  In-house referral  Clinical Social Worker      DC Planning Services  CM consult      [facility] Choice  DURABLE MEDICAL EQUIPMENT   Choice offered to / List presented to:  [name] Patient   [name]  3-N-1  [name]      [name]  [name]        Status of service:  Completed, signed off Medicare Important Message given?   (If response is "NO", the following Medicare IM given date fields will be blank) Date Medicare IM given:   Date Additional Medicare IM given:    Discharge Disposition:    Per UR Regulation:  Reviewed for med. necessity/level of care/duration of stay  If discussed at Long Length of Stay Meetings, dates discussed:    Comments:  03/15/2013 Derrick Peru RN CCM [phone] Tct worker's comp CM-Derrick [name] to advise of [name]. CM advised to call [name] at [phone] to order DME. [name] called ; spoke with Derrick [name] at intake who advised CM to send face sheet, H&P, DME orders to fax#[phone]. Pt UX#[id number] for dme order. Confirmation received. [name]  called and advised that they would be delivered RW nd 3n1 after 3PM today to hospital.

## 2013-03-15 NOTE — Evaluation (Signed)
Occupational Therapy Evaluation Patient Details Name: Derrick [name] MRN: [id number] DOB: 07/18/1956 Today's Date: 03/15/2013 Time: 4098-1191 OT Time Calculation (min): 38 min  OT Assessment / Plan / Recommendation Clinical Impression   This 57 y.o. Male admitted for lumbar decompression L4-5.  Pt is limited by pain this pm, but is motivated to work with OT.  He requires min verbal cues to incorporate back precautions into ADLs.   He is unable to safely access LEs for LB ADLs without compromising back precautions, and therefore, will benefit from adaptive equipment for home use - (hip kit) Reacher, sock aid, LH shoe horn, LH sponge to allow him to perform LB ADLs modified independently.   He will benefit from continued OT to maximize safety and independence with BADLs    OT Assessment  Patient needs continued OT Services    Follow Up Recommendations  No OT follow up;Supervision - Intermittent    Barriers to Discharge None    Equipment Recommendations  3 in 1 bedside comode;Other (comment) ((hip kit) Reacher, sock aid, LH sponge, and LH shoe horn)    Recommendations for Other Services    Frequency  Min 2X/week    Precautions / Restrictions Precautions Precautions: Back Precaution Booklet Issued: Yes (comment) Precaution Comments: Pt able to state 3/3 precautions independently, but requires min A to adhere to them during activity Restrictions Weight Bearing Restrictions: No   Pertinent Vitals/Pain     ADL  Eating/Feeding: Independent Where Assessed - Eating/Feeding: Chair;Bed level Grooming: Wash/dry hands;Wash/dry face;Min guard Where Assessed - Grooming: Supported standing Upper Body Bathing: Supervision/safety Where Assessed - Upper Body Bathing: Supported sitting Lower Body Bathing: Moderate assistance Where Assessed - Lower Body Bathing: Supported sit to stand Upper Body Dressing: Supervision/safety Where Assessed - Upper Body Dressing: Unsupported sitting Lower Body  Dressing: Moderate assistance Where Assessed - Lower Body Dressing: Supported sit to Pharmacist, hospital: Minimal assistance Statistician Method: Sit to Barista: Comfort height toilet;Grab bars Toileting - Clothing Manipulation and Hygiene: Minimal assistance Where Assessed - Engineer, mining and Hygiene: Standing Equipment Used: Rolling walker;Sock aid;Reacher;Long-handled sponge;Long-handled shoe horn Transfers/Ambulation Related to ADLs: min guard assist with RW ADL Comments: Pt. unable to cross ankles over knees to access feet for LB ADLs.  Pt and wife were instructed in the use of AE for LB ADLs. He will need a reacher, sock aid, LH sponge, and LH shoe horn (hip kit) in order to complete LB bathing and dressing without compromising his surgery.    Also discussed use of 3-in1 commode in shower    OT Diagnosis: Generalized weakness;Acute pain  OT Problem List: Decreased strength;Pain;Decreased knowledge of use of DME or AE;Decreased knowledge of precautions OT Treatment Interventions: Self-care/ADL training;DME and/or AE instruction;Therapeutic activities;Patient/family education   OT Goals Acute Rehab OT Goals OT Goal Formulation: With patient/family Time For Goal Achievement: 03/22/13 Potential to Achieve Goals: Good ADL Goals Pt Will Perform Grooming: with supervision;Standing at sink ADL Goal: Grooming - Progress: Goal set today Pt Will Perform Lower Body Bathing: with supervision;Sit to stand from chair;Sit to stand from bed;with adaptive equipment ADL Goal: Lower Body Bathing - Progress: Goal set today Pt Will Perform Lower Body Dressing: with supervision;Sit to stand from chair;Sit to stand from bed;with adaptive equipment ADL Goal: Lower Body Dressing - Progress: Goal set today Pt Will Transfer to Toilet: with supervision;Ambulation;Comfort height toilet;Grab bars;Maintaining back safety precautions ADL Goal: Toilet Transfer -  Progress: Goal set today Pt Will Perform Toileting - Clothing Manipulation:  with supervision;Standing ADL Goal: Toileting - Clothing Manipulation - Progress: Goal set today Pt Will Perform Tub/Shower Transfer: with supervision;Tub transfer;Ambulation;with DME;Maintaining back safety precautions ADL Goal: Tub/Shower Transfer - Progress: Goal set today Additional ADL Goal #1: Pt will be independent with back precautions during all activity ADL Goal: Additional Goal #1 - Progress: Goal set today  Visit Information  Last OT Received On: 03/15/13 Assistance Needed: +1    Subjective Data  Subjective: "I'm just hurting" Patient Stated Goal: To get better   Prior Functioning     Home Living Lives With: Spouse Available Help at Discharge: Family Type of Home: House Home Access: Stairs to enter Secretary/administrator of Steps: 14 Entrance Stairs-Rails: Right Home Layout: Able to live on main level with bedroom/bathroom Bathroom Shower/Tub: Health visitor: Handicapped height Home Adaptive Equipment: Environmental consultant - standard;Straight cane Additional Comments: Level entry in from garage to basement and then 14 stairs to living level Prior Function Level of Independence: Independent Able to Take Stairs?: Yes Driving: Yes Vocation: Part time employment Communication Communication: No difficulties         Vision/Perception     Cognition  Cognition Arousal/Alertness: Awake/alert Behavior During Therapy: WFL for tasks assessed/performed Overall Cognitive Status: Within Functional Limits for tasks assessed    Extremity/Trunk Assessment Right Upper Extremity Assessment RUE ROM/Strength/Tone: WFL for tasks assessed RUE Coordination: WFL - gross/fine motor Left Upper Extremity Assessment LUE ROM/Strength/Tone: WFL for tasks assessed LUE Coordination: WFL - gross/fine motor     Mobility Bed Mobility Bed Mobility: Sit to Sidelying Left Supine to Sit: 4: Min guard Sit  to Supine: 4: Min guard Sit to Sidelying Left: 4: Min assist;With rail;HOB flat Details for Bed Mobility Assistance: Pt requires min cues for technique and precautions, and assist for LEs Transfers Transfers: Sit to Stand;Stand to Sit Sit to Stand: 4: Min guard;With upper extremity assist;From toilet;From chair/3-in-1 Stand to Sit: 4: Min guard;With upper extremity assist;To bed;To toilet Details for Transfer Assistance: verbal cues for UE technique and back precautions     Exercise     Balance     End of Session OT - End of Session Activity Tolerance: Patient limited by pain Patient left: in bed;with call bell/phone within reach;with family/visitor present Nurse Communication: Patient requests pain meds  GO Functional Limitation: Self care Self Care Current Status (Z6109): At least 20 percent but less than 40 percent impaired, limited or restricted Self Care Discharge Status [phone]): At least 1 percent but less than 20 percent impaired, limited or restricted   Laconda Basich M 03/15/2013, 3:42 PM

## 2013-03-15 NOTE — Progress Notes (Signed)
03/15/2013 Colleen Can BSN RN CCM [phone] DME has bben delivered to patient's room. OT has recommended hip kit/orders obtained and sent to [name] via fax for hip/kit to be sent to patient's home. Contact information for [employer] given to patient's spouse.

## 2013-03-15 NOTE — Progress Notes (Signed)
Subjective: 1 Day Post-Op Procedure(s) (LRB): MICRO LUMBAR DECOMPRESSION L4-5 RIGHT POSSIBLE LEFT (Right) Patient reports pain as moderate.  Slight improvement in leg pain. Requesting flomax Seen by myself and Dr. Shelle Iron  Objective: Vital signs in last 24 hours: Temp:  [97.5 F (36.4 C)-100.1 F (37.8 C)] 98.2 F (36.8 C) (05/02 0459) Pulse Rate:  [88-106] 88 (05/02 0459) Resp:  [9-18] 18 (05/02 0459) BP: (118-145)/(66-80) 128/69 mmHg (05/02 0459) SpO2:  [91 %-99 %] 96 % (05/02 0459) Weight:  [89.812 kg (198 lb)] 89.812 kg (198 lb) (05/01 1405)  Intake/Output from previous day: 05/01 0701 - 05/02 0700 In: 3140 [P.O.:840; I.V.:2200; IV Piggyback:100] Out: 1550 [Urine:1500; Blood:50] Intake/Output this shift:     Recent Labs  03/13/13 1330 03/15/13 0400  HGB 15.6 13.1    Recent Labs  03/13/13 1330 03/15/13 0400  WBC 18.5* 19.3*  RBC 5.14 4.48  HCT 44.5 38.9*  PLT 220 178    Recent Labs  03/13/13 1330 03/15/13 0400  NA 138 134*  K 4.2 3.5  CL 97 95*  CO2 28 26  BUN 14 5*  CREATININE 0.52 0.41*  GLUCOSE 286* 234*  CALCIUM 9.9 8.7   No results found for this basename: LABPT, INR,  in the last 72 hours  Neurologically intact ABD soft Neurovascular intact Sensation intact distally Intact pulses distally Dorsiflexion/Plantar flexion intact Incision: dressing C/D/I and no drainage No cellulitis present Compartment soft  Assessment/Plan: 1 Day Post-Op Procedure(s) (LRB): MICRO LUMBAR DECOMPRESSION L4-5 RIGHT POSSIBLE LEFT (Right) Advance diet Up with therapy Will add Flomax Will restart Neurontin 100mg  BID (breakfast and lunch) and 300mg  QHS, will titrate up D/C later today after PT vs. Tomorrow AM  Derrick [name] M. 03/15/2013, 7:33 AM

## 2013-03-15 NOTE — Progress Notes (Signed)
Inpatient Diabetes Program Recommendations  AACE/ADA: New Consensus Statement on Inpatient Glycemic Control (2013)  Target Ranges:  Prepandial:   less than 140 mg/dL      Peak postprandial:   less than 180 mg/dL (1-2 hours)      Critically ill patients:  140 - 180 mg/dL   Hyperglycemia in 191 range  Inpatient Diabetes Program Recommendations Insulin - Basal: while here, add basal lantus of 10 units Insulin - Meal Coverage: Please add 4 units meal coverage rather than using glipiizide Oral Agents: Best not to use oral agents while in the hospital due to potential to be NPO or not able to tolerate food. HgbA1C: Please order HgbA1C currently Thank you, Lenor Coffin, RN, CNS, Diabetes Coordinator [phone])

## 2013-03-15 NOTE — Op Note (Signed)
NAME:  Derrick [name], DIEM NO.:  [id number]  MEDICAL RECORD NO.:  [id number]  LOCATION:  1605                         FACILITY:  [facility]  PHYSICIAN:  Jene Every, M.D.    DATE OF BIRTH:  1956/06/20  DATE OF PROCEDURE:  03/14/2013 DATE OF DISCHARGE:                              OPERATIVE REPORT   PREOPERATIVE DIAGNOSIS:  Spinal stenosis, herniated nucleus pulposus at L4-L5.  POSTOPERATIVE DIAGNOSIS:  Spinal stenosis, herniated nucleus pulposus at L4-L5.  PROCEDURE PERFORMED:  Lumbar decompression at L4-L5, bilateral foraminotomies at L4 and L5, and bilateral hemilaminotomy.  ANESTHESIA:  General.  ASSISTANTS:  Lanna Poche, PA.  HISTORY:  This is a 57 year old with right lower extremity radicular pain at L4-L5 nerve root distribution secondary to disk protrusion, lateral recess and foraminal stenosis prior to conservative treatment. The right greater than left leg pain, indicated for decompression, possible diskectomy, particularly at L4-L5 on the right.  He had a preoperative disk degeneration at L4-L5 and scoliosis deformity of the lumbar spine.  Risks and benefits were discussed including bleeding, infection, damage to neurovascular structures, DVT, PE, and anesthetic complications, etc.  TECHNIQUE:  With the patient in supine position, after induction of adequate general anesthesia, 2 g Kefzol, placed prone on the [place] frame.  All bony prominences were well padded.  Lumbar region was prepped and draped in the usual sterile fashion.  An 18-gauge spinal needle was utilized to localize L5 interspace, confirmed with x-ray. Just above the incision was made in spinous process, good flow of L4 to below L5.  Subcutaneous tissue was dissected.  Electrocautery was utilized to achieve hemostasis.  Dorsolumbar fascia was identified, divided, and skin incision.  Paraspinous muscle was elevated from lamina of L4-L5 bilaterally.  The patient had new space  between inner spinous processes of L4 and L5, and also on the right, he was rotated, and therefore the right side was more anterior, and there was no interlaminar window with overhanging spinous processes.  Therefore, we removed the portion of the spinous process of L4 and 5 centrally.  Using the operating microscope, it was draped, brought on the surgical field. We detached the ligamentum flavum from the cephalad edge of L5 bilaterally and the caudad edge of L4 bilaterally.  I removed the ligamentum flavum from the interspace.  There was neural patty to protect the neural elements.  Severe stenosis in the lateral recess was noted multifactorial due to the facet hypertrophy narrowing.  There was no instability noted prior to that.  I performed foraminotomies of L5 and L4, both were stenotic with L5 root compressing the lateral recess. We decompressed the lateral recess to medial border of the pedicle preserving the pars.  The disk protrusion noted was a hardened disk beneath that and an epidural venous plexus, which was cauterized.  I made an annulotomy, but there was no disk material to be excised.  We felt the foraminotomies and decompression, decompressed the L5 and L4 roots on the right.  We performed foraminotomies of L4 and L5 on the left as well.  There was some stenosis, but not to the degree as noted to the right, which was the symptomatic side.  Hockey-stick probe placed  in the foramen of L4-L5, confirmed by x-ray.  No instability was noted. Copiously irrigated the wound.  No evidence of CSF leakage or active bleeding.  Placed thrombin-soaked Gelfoam in the laminotomy defect. Removed the McCullough retractor and irrigated.  No active bleeding. Dorsolumbar fascia was reapproximated with 1 Vicryl figure-of-eight suture after thrombin-soaked Gelfoam was placed in laminotomy defect. Subcu with 2-0 and skin with staples.  Wound was dressed sterilely. Placed supine on hospital bed,  extubated without difficulty, transported to the recovery in satisfactory condition.  The patient tolerated the procedure well with no complications. Assistant was Lanna Poche, [place].  Blood loss 50 mL.  Tentatively, the difficulty increased due to the patient's disk degeneration and scoliosis deformity of the lumbar spine, which obscured the normal interlaminar window.     Jene Every, M.D.     Cordelia Pen  D:  03/14/2013  T:  03/15/2013  Job:  [id number]

## 2013-03-15 NOTE — Progress Notes (Signed)
Physical Therapy Treatment Patient Details Name: Derrick [name] MRN: [id number] DOB: 05-31-1956 Today's Date: 03/15/2013 Time: 1478-2956 PT Time Calculation (min): 22 min  PT Assessment / Plan / Recommendation Comments on Treatment Session       Follow Up Recommendations  No PT follow up     Does the patient have the potential to tolerate intense rehabilitation     Barriers to Discharge None      Equipment Recommendations  Rolling walker with 5" wheels    Recommendations for Other Services OT consult  Frequency 7X/week   Plan      Precautions / Restrictions Precautions Precautions: Back Precaution Booklet Issued: Yes (comment) Restrictions Weight Bearing Restrictions: No   Pertinent Vitals/Pain 8/10 with bed mobility, 5/10 after ambulation, premedicated    Mobility  Bed Mobility Bed Mobility: Supine to Sit;Sit to Supine Supine to Sit: 4: Min assist Sit to Supine: 4: Min assist Details for Bed Mobility Assistance: cues for correct log roll technique and sequenceing Transfers Transfers: Sit to Stand;Stand to Sit Sit to Stand: 4: Min assist Stand to Sit: 4: Min assist Details for Transfer Assistance: cues for use of UEs and maintenance of erect spine with transfers Ambulation/Gait Ambulation/Gait Assistance: 4: Min assist Ambulation Distance (Feet): 100 Feet Assistive device: Rolling walker Ambulation/Gait Assistance Details: cues posture and position from RW Gait Pattern: Step-to pattern;Step-through pattern;Decreased step length - right;Decreased step length - left    Exercises     PT Diagnosis: Difficulty walking  PT Problem List: Decreased activity tolerance;Decreased mobility;Decreased knowledge of use of DME;Decreased knowledge of precautions;Pain PT Treatment Interventions: DME instruction;Gait training;Stair training;Functional mobility training;Therapeutic activities;Therapeutic exercise;Patient/family education   PT Goals Acute Rehab PT Goals PT Goal  Formulation: With patient Time For Goal Achievement: 03/18/13 Potential to Achieve Goals: Good Pt will go Supine/Side to Sit: with supervision PT Goal: Supine/Side to Sit - Progress: Goal set today Pt will go Sit to Supine/Side: with supervision PT Goal: Sit to Supine/Side - Progress: Goal set today Pt will go Sit to Stand: with supervision PT Goal: Sit to Stand - Progress: Goal set today Pt will go Stand to Sit: with supervision PT Goal: Stand to Sit - Progress: Goal set today Pt will Ambulate: 51 - 150 feet;with supervision;with rolling walker PT Goal: Ambulate - Progress: Goal set today Pt will Go Up / Down Stairs: Flight;with min assist;with least restrictive assistive device PT Goal: Up/Down Stairs - Progress: Goal set today  Visit Information  Last PT Received On: 03/15/13 Assistance Needed: +1    Subjective Data  Subjective: Frustrated with continued R LE pain  Patient Stated Goal: Resume previous lifestyle with decreased pain   Cognition  Cognition Arousal/Alertness: Awake/alert Behavior During Therapy: WFL for tasks assessed/performed Overall Cognitive Status: Within Functional Limits for tasks assessed    Balance     End of Session PT - End of Session Activity Tolerance: Patient tolerated treatment well Patient left: in chair;with call bell/phone within reach;with family/visitor present Nurse Communication: Mobility status   GP Functional Assessment Tool Used: clinical judgement Functional Limitation: Mobility: Walking and moving around Mobility: Walking and Moving Around Current Status [phone]): At least 20 percent but less than 40 percent impaired, limited or restricted Mobility: Walking and Moving Around Goal Status [phone]): At least 1 percent but less than 20 percent impaired, limited or restricted   Derrick [name] 03/15/2013, 1:11 PM

## 2013-03-15 NOTE — Progress Notes (Signed)
Physical Therapy Treatment Patient Details Name: Derrick [name] MRN: [id number] DOB: 1956-09-14 Today's Date: 03/15/2013 Time: 7829-5621 PT Time Calculation (min): 11 min  PT Assessment / Plan / Recommendation Comments on Treatment Session       Follow Up Recommendations  No PT follow up     Does the patient have the potential to tolerate intense rehabilitation     Barriers to Discharge        Equipment Recommendations  Rolling walker with 5" wheels    Recommendations for Other Services OT consult  Frequency 7X/week   Plan Discharge plan remains appropriate    Precautions / Restrictions Precautions Precautions: Back Precaution Booklet Issued: Yes (comment) Restrictions Weight Bearing Restrictions: No   Pertinent Vitals/Pain 5-6/10; premed    Mobility  Bed Mobility Bed Mobility: Supine to Sit;Sit to Supine Supine to Sit: 4: Min guard Sit to Supine: 4: Min guard Details for Bed Mobility Assistance: cues for correct log roll technique and sequenceing Transfers Transfers: Sit to Stand;Stand to Sit Sit to Stand: 4: Min guard Stand to Sit: 4: Min guard Details for Transfer Assistance: cues for use of UEs and maintenance of erect spine with transfers Ambulation/Gait Ambulation/Gait Assistance: 4: Min guard Ambulation Distance (Feet): 200 Feet Assistive device: Rolling walker Ambulation/Gait Assistance Details: min cues for position from RW Gait Pattern: Step-to pattern;Step-through pattern;Decreased step length - right;Decreased step length - left Stairs: No    Exercises     PT Diagnosis:    PT Problem List:   PT Treatment Interventions:     PT Goals Acute Rehab PT Goals PT Goal Formulation: With patient Time For Goal Achievement: 03/18/13 Potential to Achieve Goals: Good Pt will go Supine/Side to Sit: with supervision PT Goal: Supine/Side to Sit - Progress: Progressing toward goal Pt will go Sit to Supine/Side: with supervision PT Goal: Sit to Supine/Side -  Progress: Progressing toward goal Pt will go Sit to Stand: with supervision PT Goal: Sit to Stand - Progress: Progressing toward goal Pt will go Stand to Sit: with supervision PT Goal: Stand to Sit - Progress: Progressing toward goal Pt will Ambulate: 51 - 150 feet;with supervision;with rolling walker PT Goal: Ambulate - Progress: Progressing toward goal Pt will Go Up / Down Stairs: Flight;with min assist;with least restrictive assistive device PT Goal: Up/Down Stairs - Progress: Goal set today  Visit Information  Last PT Received On: 03/15/13 Assistance Needed: +1    Subjective Data  Subjective: I am not leaving until tomorrow Patient Stated Goal: Resume previous lifestyle with decreased pain   Cognition  Cognition Arousal/Alertness: Awake/alert Behavior During Therapy: WFL for tasks assessed/performed Overall Cognitive Status: Within Functional Limits for tasks assessed    Balance     End of Session PT - End of Session Activity Tolerance: Patient tolerated treatment well Patient left: in chair;with call bell/phone within reach;with family/visitor present Nurse Communication: Mobility status   GP     Derrick [name] 03/15/2013, 2:48 PM

## 2013-03-16 NOTE — Progress Notes (Signed)
Subjective: 2 Days Post-Op Procedure(s) (LRB): MICRO LUMBAR DECOMPRESSION L4-5 RIGHT POSSIBLE LEFT (Right) Patient reports pain as 3 on 0-10 scale.    Objective: Vital signs in last 24 hours: Temp:  [97.6 F (36.4 C)-98.4 F (36.9 C)] 98 F (36.7 C) (05/03 1610) Pulse Rate:  [87-88] 88 (05/03 0638) Resp:  [15-16] 16 (05/03 0638) BP: (128-149)/(74-80) 135/76 mmHg (05/03 0638) SpO2:  [91 %-98 %] 91 % (05/03 9604)  Intake/Output from previous day: 05/02 0701 - 05/03 0700 In: 533 [P.O.:480; I.V.:3; IV Piggyback:50] Out: 500 [Urine:500] Intake/Output this shift:     Recent Labs  03/13/13 1330 03/15/13 0400  HGB 15.6 13.1    Recent Labs  03/13/13 1330 03/15/13 0400  WBC 18.5* 19.3*  RBC 5.14 4.48  HCT 44.5 38.9*  PLT 220 178    Recent Labs  03/13/13 1330 03/15/13 0400  NA 138 134*  K 4.2 3.5  CL 97 95*  CO2 28 26  BUN 14 5*  CREATININE 0.52 0.41*  GLUCOSE 286* 234*  CALCIUM 9.9 8.7   No results found for this basename: LABPT, INR,  in the last 72 hours  Neurologically intact Sensation intact distally Intact pulses distally Compartment soft dressing C/D/I. WBC steroid elevation. No sign of infection. No dysuria. No cough.  Assessment/Plan: 2 Days Post-Op Procedure(s) (LRB): MICRO LUMBAR DECOMPRESSION L4-5 RIGHT POSSIBLE LEFT (Right) Discharge home with home health Glucose follow. See medical MD. D/C instructions given.  Derrick [name] C 03/16/2013, 8:13 AM

## 2013-03-16 NOTE — Care Management Note (Signed)
Received call from patient's nurse indicating that there is something wrong with the rolling walker wheel. Call place to oncall worker's comp- [employer] at [phone]--made them aware that there were issues with patient's wheel on his walker that was delivered by [employer] and if the DME company can follow up with patient as he is being discharged. Provided contact numbers of patient to company. Made patient's nurse aware that DME company to follow up with patient. [place], [place] [phone]

## 2013-03-16 NOTE — Progress Notes (Signed)
Physical Therapy Treatment Patient Details Name: Derrick [name] MRN: [id number] DOB: February 03, 1956 Today's Date: 03/16/2013 Time: 0810-0827 PT Time Calculation (min): 17 min  PT Assessment / Plan / Recommendation Comments on Treatment Session       Follow Up Recommendations  No PT follow up     Does the patient have the potential to tolerate intense rehabilitation     Barriers to Discharge        Equipment Recommendations  Rolling walker with 5" wheels    Recommendations for Other Services OT consult  Frequency 7X/week   Plan Discharge plan remains appropriate    Precautions / Restrictions Precautions Precautions: Back Precaution Booklet Issued: Yes (comment) Precaution Comments: Pt recalls 3/4 back precautions without cues but requires cueing for follow through Restrictions Weight Bearing Restrictions: No   Pertinent Vitals/Pain 6/10; premedicated, RN supplying muscle relaxer    Mobility  Bed Mobility Bed Mobility: Supine to Sit Supine to Sit: 5: Supervision Details for Bed Mobility Assistance: cues required to complete log roll Transfers Transfers: Sit to Stand;Stand to Sit Sit to Stand: 5: Supervision Stand to Sit: 5: Supervision Details for Transfer Assistance: min cues for erect spine Ambulation/Gait Ambulation/Gait Assistance: 5: Supervision Ambulation Distance (Feet): 300 Feet Assistive device: Rolling walker Ambulation/Gait Assistance Details: cues for position from RW and posture Gait Pattern: Step-through pattern Stairs: Yes Stairs Assistance: 5: Supervision Stairs Assistance Details (indicate cue type and reason): Pt utilizing same step to as prior to surgery Stair Management Technique: One rail Right;Step to pattern;Forwards Number of Stairs: 4    Exercises     PT Diagnosis:    PT Problem List:   PT Treatment Interventions:     PT Goals Acute Rehab PT Goals PT Goal Formulation: With patient Time For Goal Achievement: 03/18/13 Potential to  Achieve Goals: Good Pt will go Supine/Side to Sit: with supervision PT Goal: Supine/Side to Sit - Progress: Met Pt will go Sit to Supine/Side: with supervision PT Goal: Sit to Supine/Side - Progress: Met Pt will go Sit to Stand: with supervision PT Goal: Sit to Stand - Progress: Met Pt will go Stand to Sit: with supervision PT Goal: Stand to Sit - Progress: Met Pt will Ambulate: 51 - 150 feet;with supervision;with rolling walker PT Goal: Ambulate - Progress: Met Pt will Go Up / Down Stairs: Flight;with min assist;with least restrictive assistive device PT Goal: Up/Down Stairs - Progress: Progressing toward goal  Visit Information  Last PT Received On: 03/16/13 Assistance Needed: +1    Subjective Data  Subjective: I'm doing some better, that grinding pain in my R buttock is not there anymore. Patient Stated Goal: Resume previous lifestyle with decreased pain   Cognition  Cognition Arousal/Alertness: Awake/alert Behavior During Therapy: WFL for tasks assessed/performed Overall Cognitive Status: Within Functional Limits for tasks assessed    Balance     End of Session PT - End of Session Activity Tolerance: Patient tolerated treatment well Patient left: in chair;with call bell/phone within reach;with family/visitor present Nurse Communication: Mobility status   GP Functional Assessment Tool Used: clinical judgement Functional Limitation: Mobility: Walking and moving around Mobility: Walking and Moving Around Current Status (W0981): At least 1 percent but less than 20 percent impaired, limited or restricted Mobility: Walking and Moving Around Discharge Status [phone]): At least 1 percent but less than 20 percent impaired, limited or restricted   [name] 03/16/2013, 8:35 AM

## 2013-03-16 NOTE — Discharge Summary (Signed)
Physician Discharge Summary   Patient ID: Derrick [name] MRN: [id number] DOB/AGE: 1956/07/14 57 y.o.  Admit date: 03/14/2013 Discharge date: 03/16/2013  Primary Diagnosis:   STENOSIS AND HNP L4-5  Admission Diagnoses:  Past Medical History  Diagnosis Date  . Diabetes mellitus   . Spinal stenosis, lumbar   . Psoriasis    Discharge Diagnoses:   Principal Problem:   Spinal stenosis, lumbar region, with neurogenic claudication Active Problems:   Type II or unspecified type diabetes mellitus without mention of complication, not stated as uncontrolled  Procedure:  Procedure(s) (LRB): MICRO LUMBAR DECOMPRESSION L4-5 RIGHT POSSIBLE LEFT (Right)   Consults: None  HPI:  see H&P    Laboratory Data: Hospital Outpatient Visit on 03/13/2013  Component Date Value Range Status  . MRSA, PCR 03/13/2013 NEGATIVE  NEGATIVE Final  . Staphylococcus aureus 03/13/2013 NEGATIVE  NEGATIVE Final   Comment:                                 The Xpert SA Assay (FDA                          approved for NASAL specimens                          in patients over 58 years of age),                          is one component of                          a comprehensive surveillance                          program.  Test performance has                          been validated by [employer] for patients greater                          than or equal to 40 year old.                          It is not intended                          to diagnose infection nor to                          guide or monitor treatment.  . Sodium 03/13/2013 138  135 - 145 mEq/L Final  . Potassium 03/13/2013 4.2  3.5 - 5.1 mEq/L Final  . Chloride 03/13/2013 97  96 - 112 mEq/L Final  . CO2 03/13/2013 28  19 - 32 mEq/L Final  . Glucose, Bld 03/13/2013 286* 70 - 99 mg/dL Final  . BUN 78/29/5621 14  6 - 23 mg/dL Final  . Creatinine, Ser 03/13/2013 0.52  0.50 - 1.35 mg/dL Final  . Calcium 30/86/5784 9.9   8.4 -  10.5 mg/dL Final  . GFR calc non Af Amer 03/13/2013 >90  >90 mL/min Final  . GFR calc Af Amer 03/13/2013 >90  >90 mL/min Final   Comment:                                 The eGFR has been calculated                          using the CKD EPI equation.                          This calculation has not been                          validated in all clinical                          situations.                          eGFR's persistently                          <90 mL/min signify                          possible Chronic Kidney Disease.  . WBC 03/13/2013 18.5* 4.0 - 10.5 K/uL Final  . RBC 03/13/2013 5.14  4.22 - 5.81 MIL/uL Final  . Hemoglobin 03/13/2013 15.6  13.0 - 17.0 g/dL Final  . HCT 04/54/0981 44.5  39.0 - 52.0 % Final  . MCV 03/13/2013 86.6  78.0 - 100.0 fL Final  . MCH 03/13/2013 30.4  26.0 - 34.0 pg Final  . MCHC 03/13/2013 35.1  30.0 - 36.0 g/dL Final  . RDW 19/14/7829 12.5  11.5 - 15.5 % Final  . Platelets 03/13/2013 220  150 - 400 K/uL Final    Recent Labs  03/15/13 0400  HGB 13.1    Recent Labs  03/15/13 0400  WBC 19.3*  RBC 4.48  HCT 38.9*  PLT 178    Recent Labs  03/15/13 0400  NA 134*  K 3.5  CL 95*  CO2 26  BUN 5*  CREATININE 0.41*  GLUCOSE 234*  CALCIUM 8.7   No results found for this basename: LABPT, INR,  in the last 72 hours  X-Rays:Dg Lumbar Spine 2-3 Views  03/13/2013  *RADIOLOGY REPORT*  Clinical Data: Preop for spinal decompression.  LUMBAR SPINE - 2-3 VIEW  Comparison: None.  Findings: Normal alignment of the lumbar vertebral bodies.  Mild to moderate degenerative disc disease and facet disease.  No acute bony findings or destructive bony changes.  There are five non-rib bearing lumbar type vertebral bodies.  SI joint degenerative changes are noted.  IMPRESSION: Normal alignment and no acute bony findings Mild multilevel disc disease and facet disease.   Original Report Authenticated By: Rudie Meyer, M.D.    Dg Spine Portable 1  View  03/14/2013  *RADIOLOGY REPORT*  Clinical Data: L4-L5 back surgery  PORTABLE SPINE - 1 VIEW  Comparison: Plain film 03/24/2013 10/13/1979  Findings: Single lateral view of the lumbar spine demonstrates skin spreaders posterior to the L5 vertebral body.  There are two sharp tip probes spanning the  L4-L5 disc space.  IMPRESSION: Intraoperative view of the lumbar spine as described above.   Original Report Authenticated By: Genevive Bi, M.D.    Dg Spine Portable 1 View  03/14/2013  *RADIOLOGY REPORT*  Clinical Data: Surgical level at L4-L5  PORTABLE SPINE - 1 VIEW  Comparison: Plain film 03/25/2039 the 1120 hours  Findings: Single lateral view of the lumbar spine demonstrates skin spreaders posterior to the L5 vertebral body.  There are surgical instruments on the L4 and L5 spinous processes.  IMPRESSION: Intraoperative views of the lumbar spine as above.   Original Report Authenticated By: Genevive Bi, M.D.    Dg Spine Portable 1 View  03/14/2013  *RADIOLOGY REPORT*  Clinical Data: Lumbar surgery  PORTABLE SPINE - 1 VIEW  Comparison: Plain film 03/14/2013 at 11 and 9 hours  Findings: Single lateral view of the lumbar spine demonstrates a curve tip probe at the L5-S1 disc space.  Skin spreaders at the same level.  IMPRESSION: Intraoperative view of the lumbar spine as above.   Original Report Authenticated By: Genevive Bi, M.D.    Dg Spine Portable 1 View  03/14/2013  *RADIOLOGY REPORT*  Clinical Data: Lumbar spine surgery  PORTABLE SPINE - 1 VIEW  Comparison: Plain films for paired to 1014  Findings: Using the numbering convention of the comparison radiograph from 02/14/2013, a sharp tip probe is positioned posterior to the spinous process of the L5 vertebral body.  IMPRESSION: Intraoperative views as above.   Original Report Authenticated By: Genevive Bi, M.D.     EKG: Orders placed during the hospital encounter of 05/29/12  . EKG     Hospital Course: Patient was admitted to [facility] and taken to the OR and underwent the above state procedure without complications.  Patient tolerated the procedure well and was later transferred to the recovery room and then to the orthopaedic floor for postoperative care.  They were given PO and IV analgesics for pain control following their surgery.  They were given 24 hours of postoperative antibiotics.   PT was consulted postop to assist with mobility and transfers.  The patient was allowed to be WBAT with therapy and was taught back precautions. Discharge planning was consulted to help with postop disposition and equipment needs.  Patient had a fair night on the evening of surgery and started to get up OOB with therapy on day one but noted continued pain. Patient was seen in rounds daily and was ready to go home on day two.  They were given discharge instructions and dressing directions.  They were instructed on when to follow up in the office with Dr. Shelle Iron.  Discharge Medications: Prior to Admission medications   Medication Sig Start Date End Date Taking? Authorizing Provider  Aspirin-Acetaminophen-Caffeine (GOODY HEADACHE PO) Take 1 packet by mouth daily as needed (for pain).    Yes Historical Provider, MD  glyBURIDE-metformin (GLUCOVANCE) 5-500 MG per tablet Take 1-2 tablets by mouth 2 (two) times daily. 1 in am and 2 in the evening at supper   Yes Historical Provider, MD  gabapentin (NEURONTIN) 100 MG capsule For the first 3 days, take 1 pill in the morning and afternoon and 3 pills before bed. For the next 3 days, take 3 pills in the morning, 1 pill in the afternoon, and 3 pills before bed. After that, take 3 pills in the morning, afternoon, and before bed. 03/15/13   Dayna Barker. Calistro Rauf, PA-C  methocarbamol (ROBAXIN) 500 MG tablet Take 1 tablet (500  mg total) by mouth 3 (three) times daily. 03/14/13   Javier Docker, MD  oxyCODONE-acetaminophen (PERCOCET) 5-325 MG per tablet Take 1 tablet by mouth every 4 (four) hours as needed for  pain. 03/14/13   Javier Docker, MD    Diet: Diabetic diet Activity:WBAT Follow-up:in 10-14 days Disposition - Home Discharged Condition: good   Discharge Orders   Future Orders Complete By Expires     Call MD / Call 911  As directed     Comments:      If you experience chest pain or shortness of breath, CALL 911 and be transported to the hospital emergency room.  If you develope a fever above 101 F, pus (white drainage) or increased drainage or redness at the wound, or calf pain, call your surgeon's office.    Constipation Prevention  As directed     Comments:      Drink plenty of fluids.  Prune juice may be helpful.  You may use a stool softener, such as Colace (over the counter) 100 mg twice a day.  Use MiraLax (over the counter) for constipation as needed.    Diet - low sodium heart healthy  As directed     Increase activity slowly as tolerated  As directed         Medication List    STOP taking these medications       HYDROcodone-acetaminophen 5-325 MG per tablet  Commonly known as:  NORCO/VICODIN      TAKE these medications       gabapentin 100 MG capsule  Commonly known as:  NEURONTIN  For the first 3 days, take 1 pill in the morning and afternoon and 3 pills before bed. For the next 3 days, take 3 pills in the morning, 1 pill in the afternoon, and 3 pills before bed. After that, take 3 pills in the morning, afternoon, and before bed.     glyBURIDE-metformin 5-500 MG per tablet  Commonly known as:  GLUCOVANCE  Take 1-2 tablets by mouth 2 (two) times daily. 1 in am and 2 in the evening at supper     GOODY HEADACHE PO  Take 1 packet by mouth daily as needed (for pain).     methocarbamol 500 MG tablet  Commonly known as:  ROBAXIN  Take 1 tablet (500 mg total) by mouth 3 (three) times daily.     oxyCODONE-acetaminophen 5-325 MG per tablet  Commonly known as:  PERCOCET  Take 1 tablet by mouth every 4 (four) hours as needed for pain.           Follow-up  Information   Follow up with BEANE,JEFFREY C, MD In 2 weeks.   Contact information:   [address] [place] [address] [phone]       Signed: Dorothy Spark. 03/16/2013, 3:56 PM

## 2013-03-16 NOTE — Progress Notes (Signed)
Pt to d/c home. AVS reviewed and "My Chart" discussed with pt. Pt capable of verbalizing medications and follow-up appointments. Remains hemodynamically stable. No signs and symptoms of distress. Educated pt to return to ER in the case of SOB, dizziness, or chest pain.  

## 2013-03-16 NOTE — Progress Notes (Signed)
Occupational Therapy Treatment Patient Details Name: Derrick [name] MRN: [id number] DOB: Sep 29, 1956 Today's Date: 03/16/2013 Time: 4098-1191 OT Time Calculation (min): 15 min  OT Assessment / Plan / Recommendation Comments on Treatment Session Pt able to verbalize back precautions and good return demo. Pt with 6 out 10 pain s/p PT session. Rn present and providing pain medication. Adequate level for d/c home    Follow Up Recommendations  No OT follow up;Supervision - Intermittent    Barriers to Discharge       Equipment Recommendations  3 in 1 bedside comode;Other (comment)    Recommendations for Other Services    Frequency Min 2X/week   Plan Discharge plan remains appropriate    Precautions / Restrictions Precautions Precautions: Back Precaution Booklet Issued: Yes (comment) Precaution Comments: Pt recalls 3/4 back precautions without cues but requires cueing for follow through Restrictions Weight Bearing Restrictions: No   Pertinent Vitals/Pain Medicated by RN prior to OT session    ADL  Grooming: Wash/dry hands;Modified independent Where Assessed - Grooming: Unsupported standing Toilet Transfer: Modified independent Toilet Transfer Method: Sit to Barista: Comfort height toilet;Grab bars Equipment Used: Rolling walker Transfers/Ambulation Related to ADLs: supervision with RW ADL Comments: Pt and wife asking questions about daily routine. Pt verbalized correct sequence for bed mobility. Pt completed toilet transfer and without questions. pt educated on pain management and staying ahead of pain as MD ordered. Pt and wife educated on car for transfers home. Wife educated on home setup for maintaining back precautions.    OT Diagnosis:    OT Problem List:   OT Treatment Interventions:     OT Goals Acute Rehab OT Goals OT Goal Formulation: With patient/family Time For Goal Achievement: 03/22/13 Potential to Achieve Goals: Good ADL Goals Pt Will  Perform Grooming: with supervision;Standing at sink ADL Goal: Grooming - Progress: Met Pt Will Perform Lower Body Bathing: with supervision;Sit to stand from chair;Sit to stand from bed;with adaptive equipment ADL Goal: Lower Body Bathing - Progress: Progressing toward goals Pt Will Perform Lower Body Dressing: with supervision;Sit to stand from chair;Sit to stand from bed;with adaptive equipment ADL Goal: Lower Body Dressing - Progress: Progressing toward goals Pt Will Transfer to Toilet: with supervision;Ambulation;Comfort height toilet;Grab bars;Maintaining back safety precautions ADL Goal: Toilet Transfer - Progress: Met Pt Will Perform Toileting - Clothing Manipulation: with supervision;Standing ADL Goal: Toileting - Clothing Manipulation - Progress: Progressing toward goals Pt Will Perform Tub/Shower Transfer: with supervision;Tub transfer;Ambulation;with DME;Maintaining back safety precautions Additional ADL Goal #1: Pt will be independent with back precautions during all activity ADL Goal: Additional Goal #1 - Progress: Met  Visit Information  Last OT Received On: 03/16/13 Assistance Needed: +1    Subjective Data      Prior Functioning       Cognition  Cognition Arousal/Alertness: Awake/alert Behavior During Therapy: WFL for tasks assessed/performed Overall Cognitive Status: Within Functional Limits for tasks assessed    Mobility  Bed Mobility Bed Mobility: Not assessed (verbally discussed) Supine to Sit: 5: Supervision Details for Bed Mobility Assistance: cues required to complete log roll Transfers Sit to Stand: 5: Supervision Stand to Sit: 5: Supervision Details for Transfer Assistance: min v/c for hand placement    Exercises      Balance     End of Session OT - End of Session Activity Tolerance: Patient tolerated treatment well Patient left: in chair;with call bell/phone within reach;with family/visitor present Nurse Communication: Mobility  status;Precautions  GO Functional Limitation: Self care Self Care  Discharge Status [phone]): At least 1 percent but less than 20 percent impaired, limited or restricted   Lucile Shutters 03/16/2013, 8:45 AM Pager: [phone]

## 2013-07-06 IMAGING — CR DG CHEST 2V
2 series · 2 of 2 positions shown · non-contrast
Comparison: None.

CLINICAL DATA: Preop left inguinal hernia repair.

CHEST - 2 VIEW

[w chest pa]
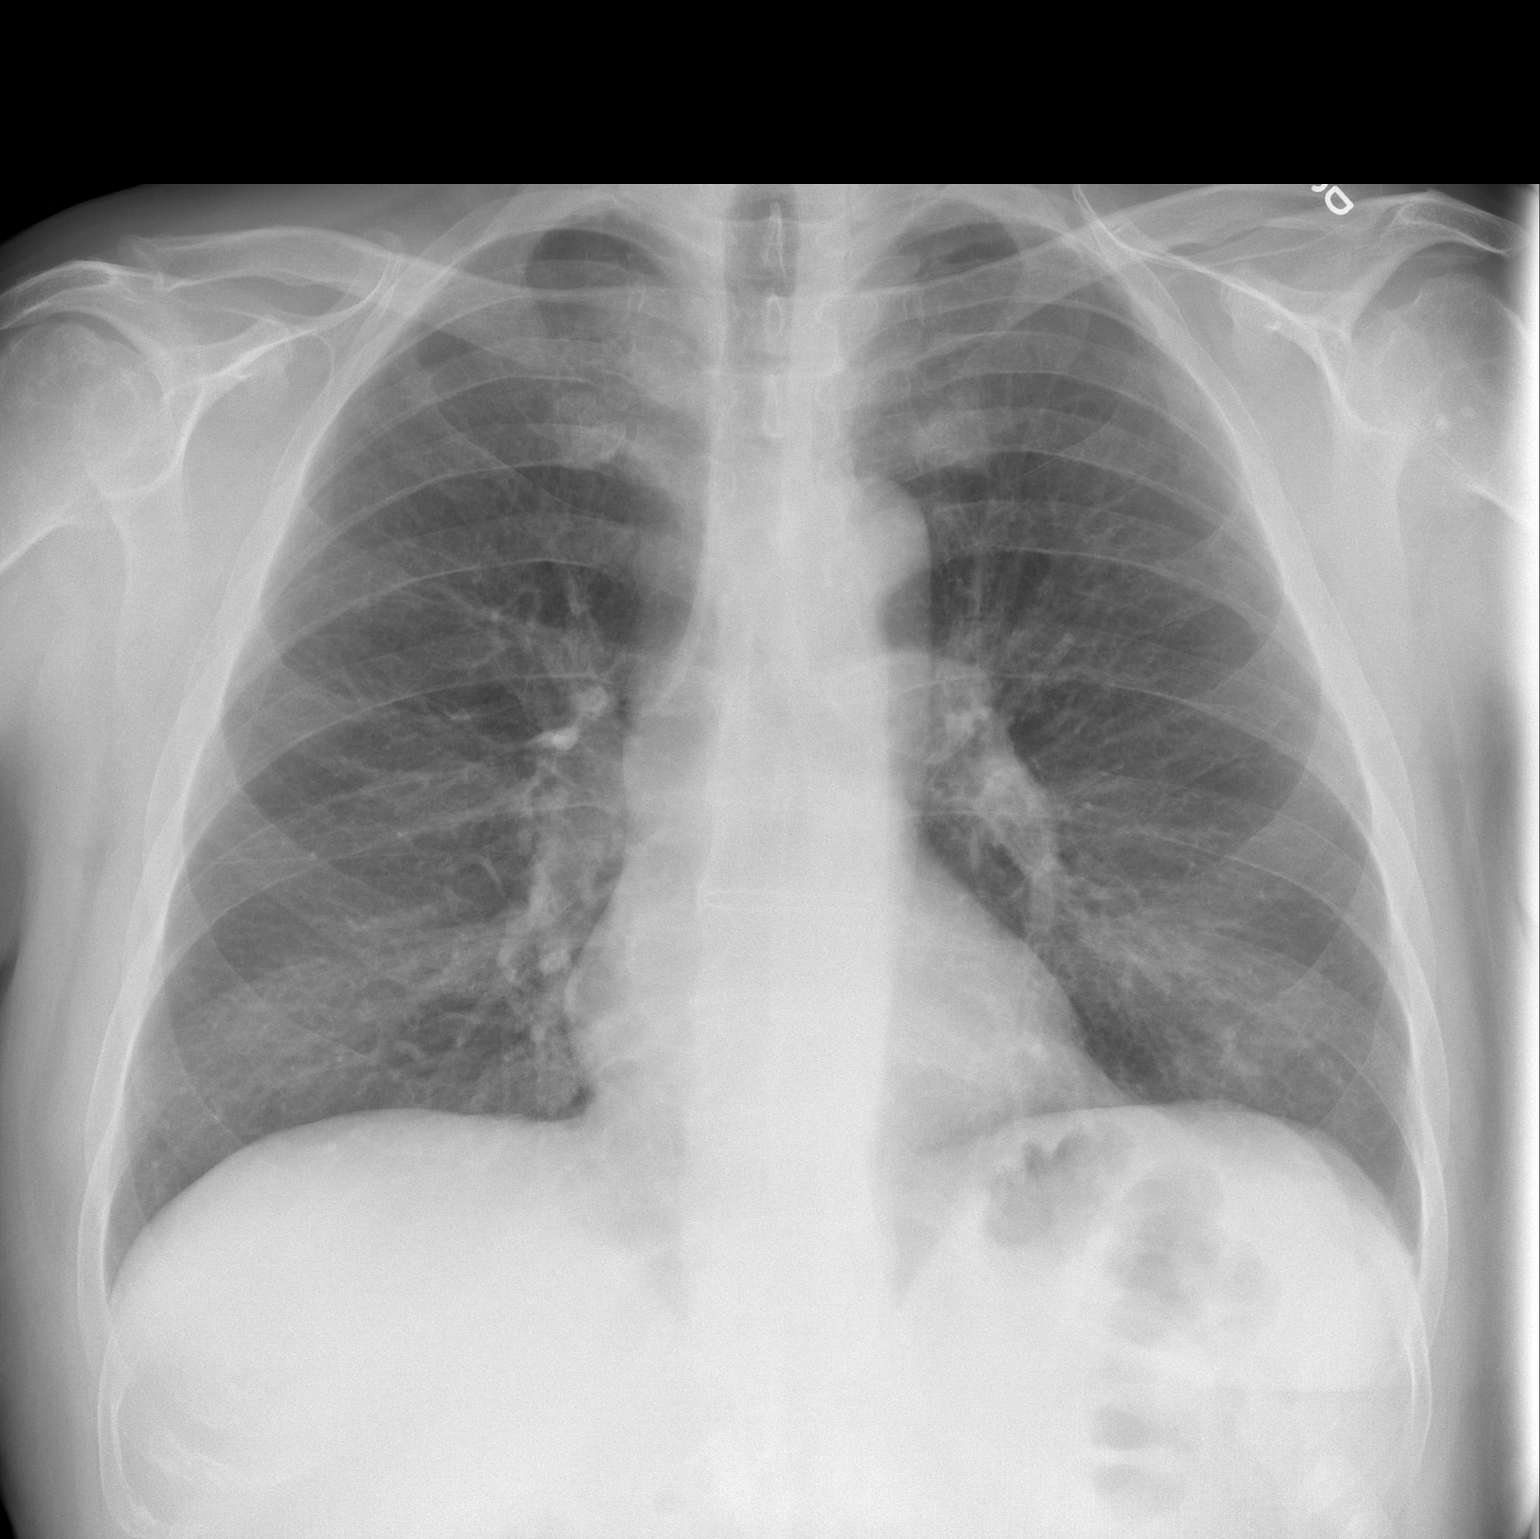

[w chest lat]
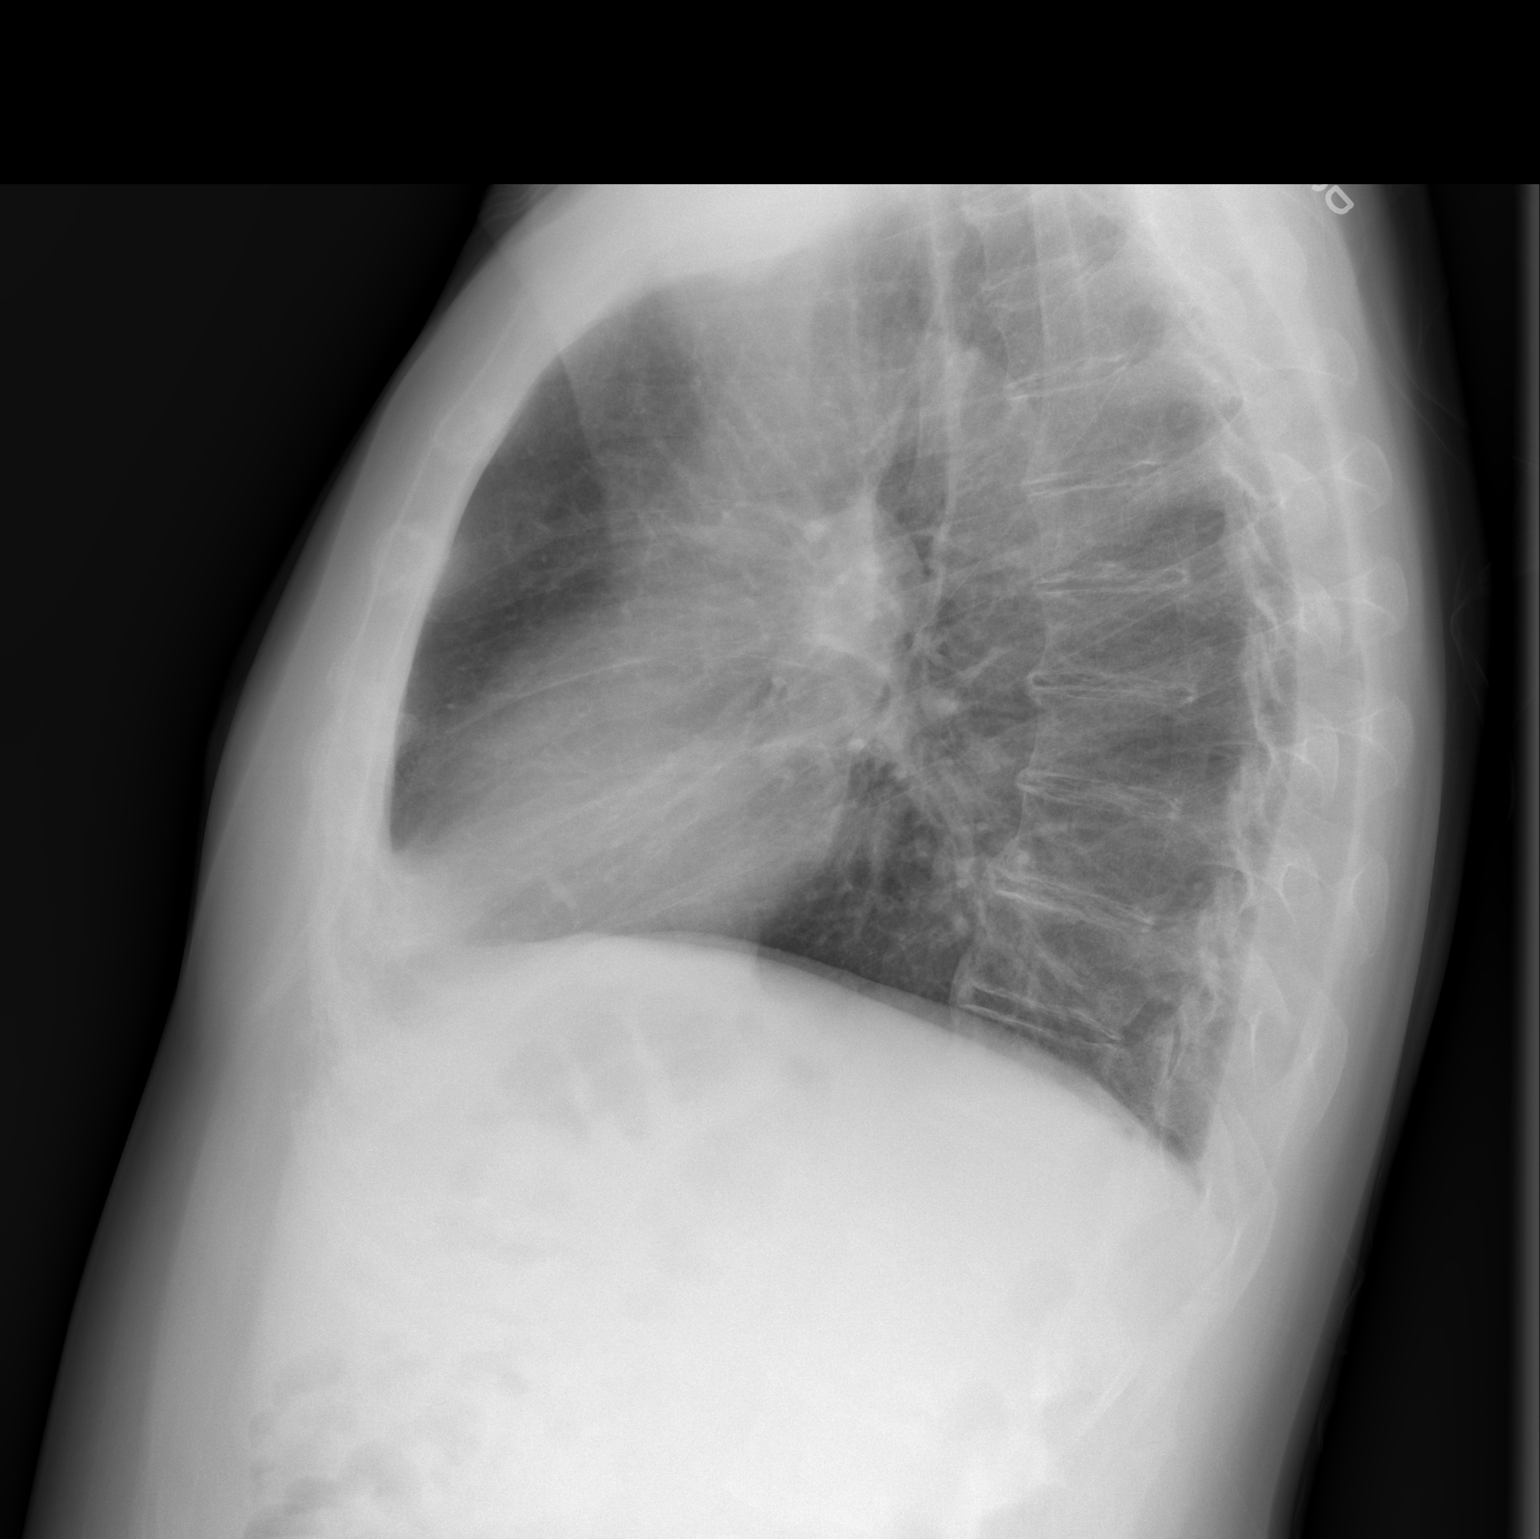

[2 of 2 positions shown; findings below may reference images not displayed]

FINDINGS: Trachea is midline.  Heart size normal.  Lungs are clear.
No pleural fluid.
IMPRESSION: No acute findings.

## 2014-04-21 IMAGING — CR DG LUMBAR SPINE 2-3V
3 series · 3 of 3 positions shown · non-contrast
Comparison: None.

CLINICAL DATA: Preop for spinal decompression.

LUMBAR SPINE - 2-3 VIEW

[t l-spine a.p.]
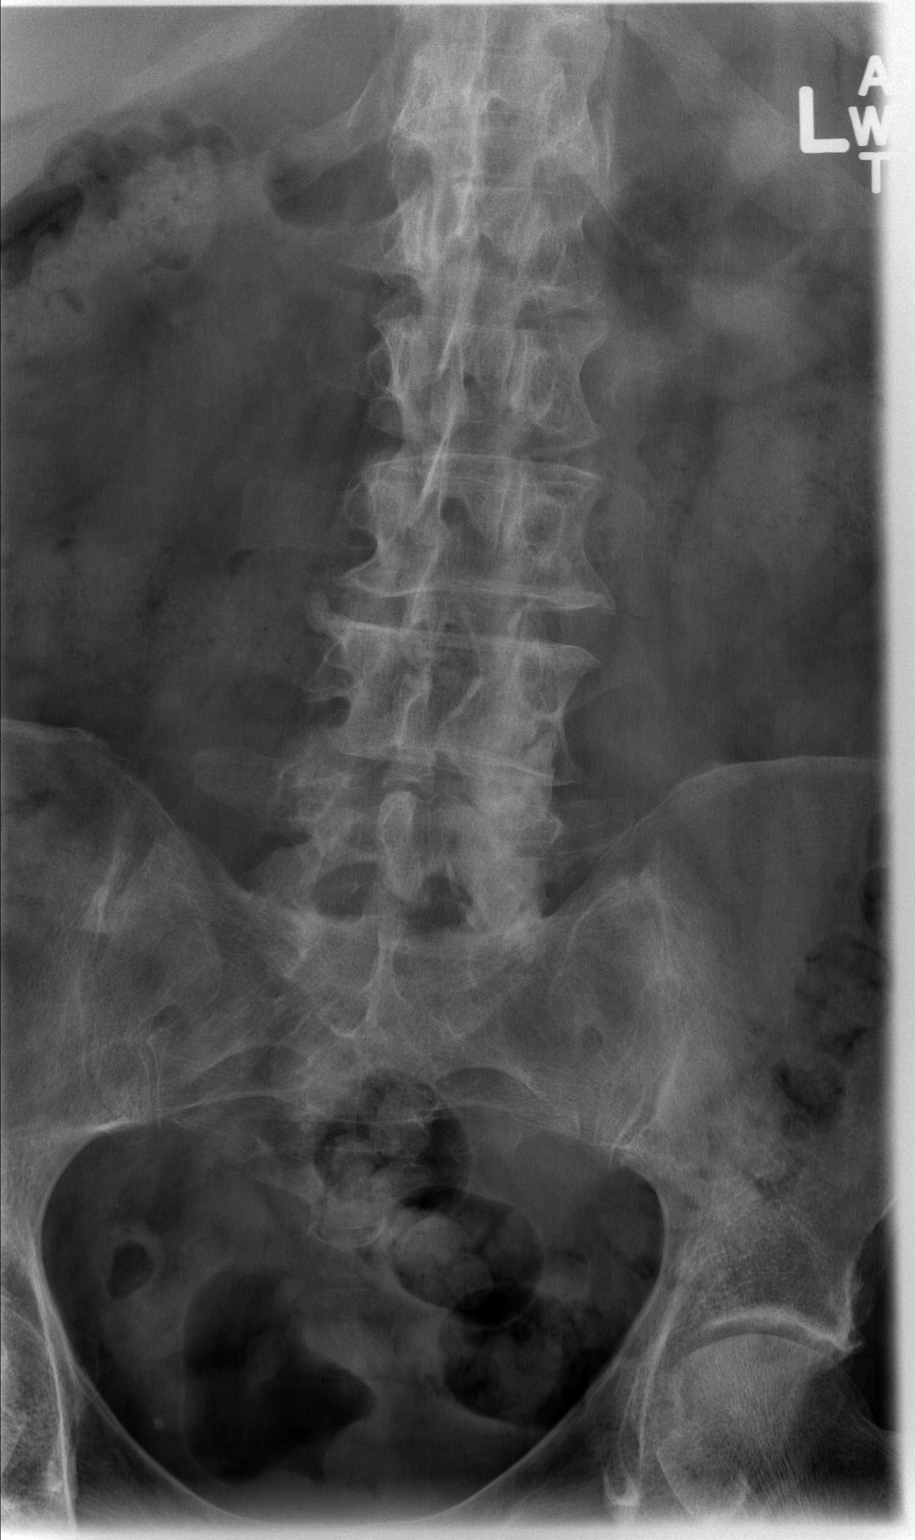

[t l-spine lat]
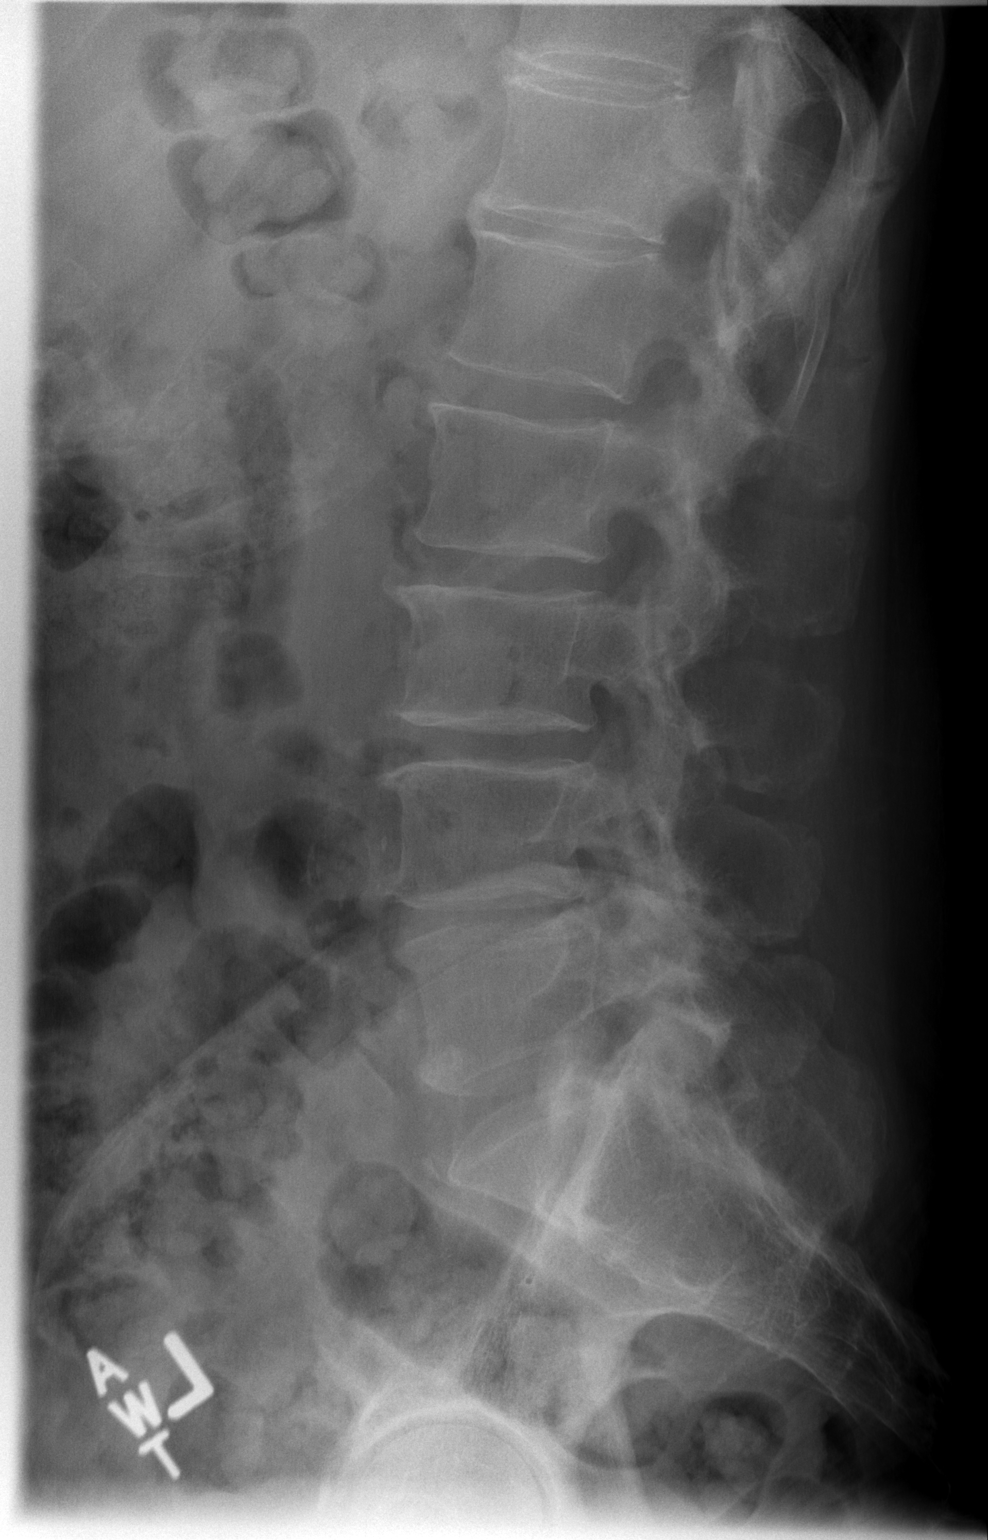

[t l-spine l5-s1 spot]
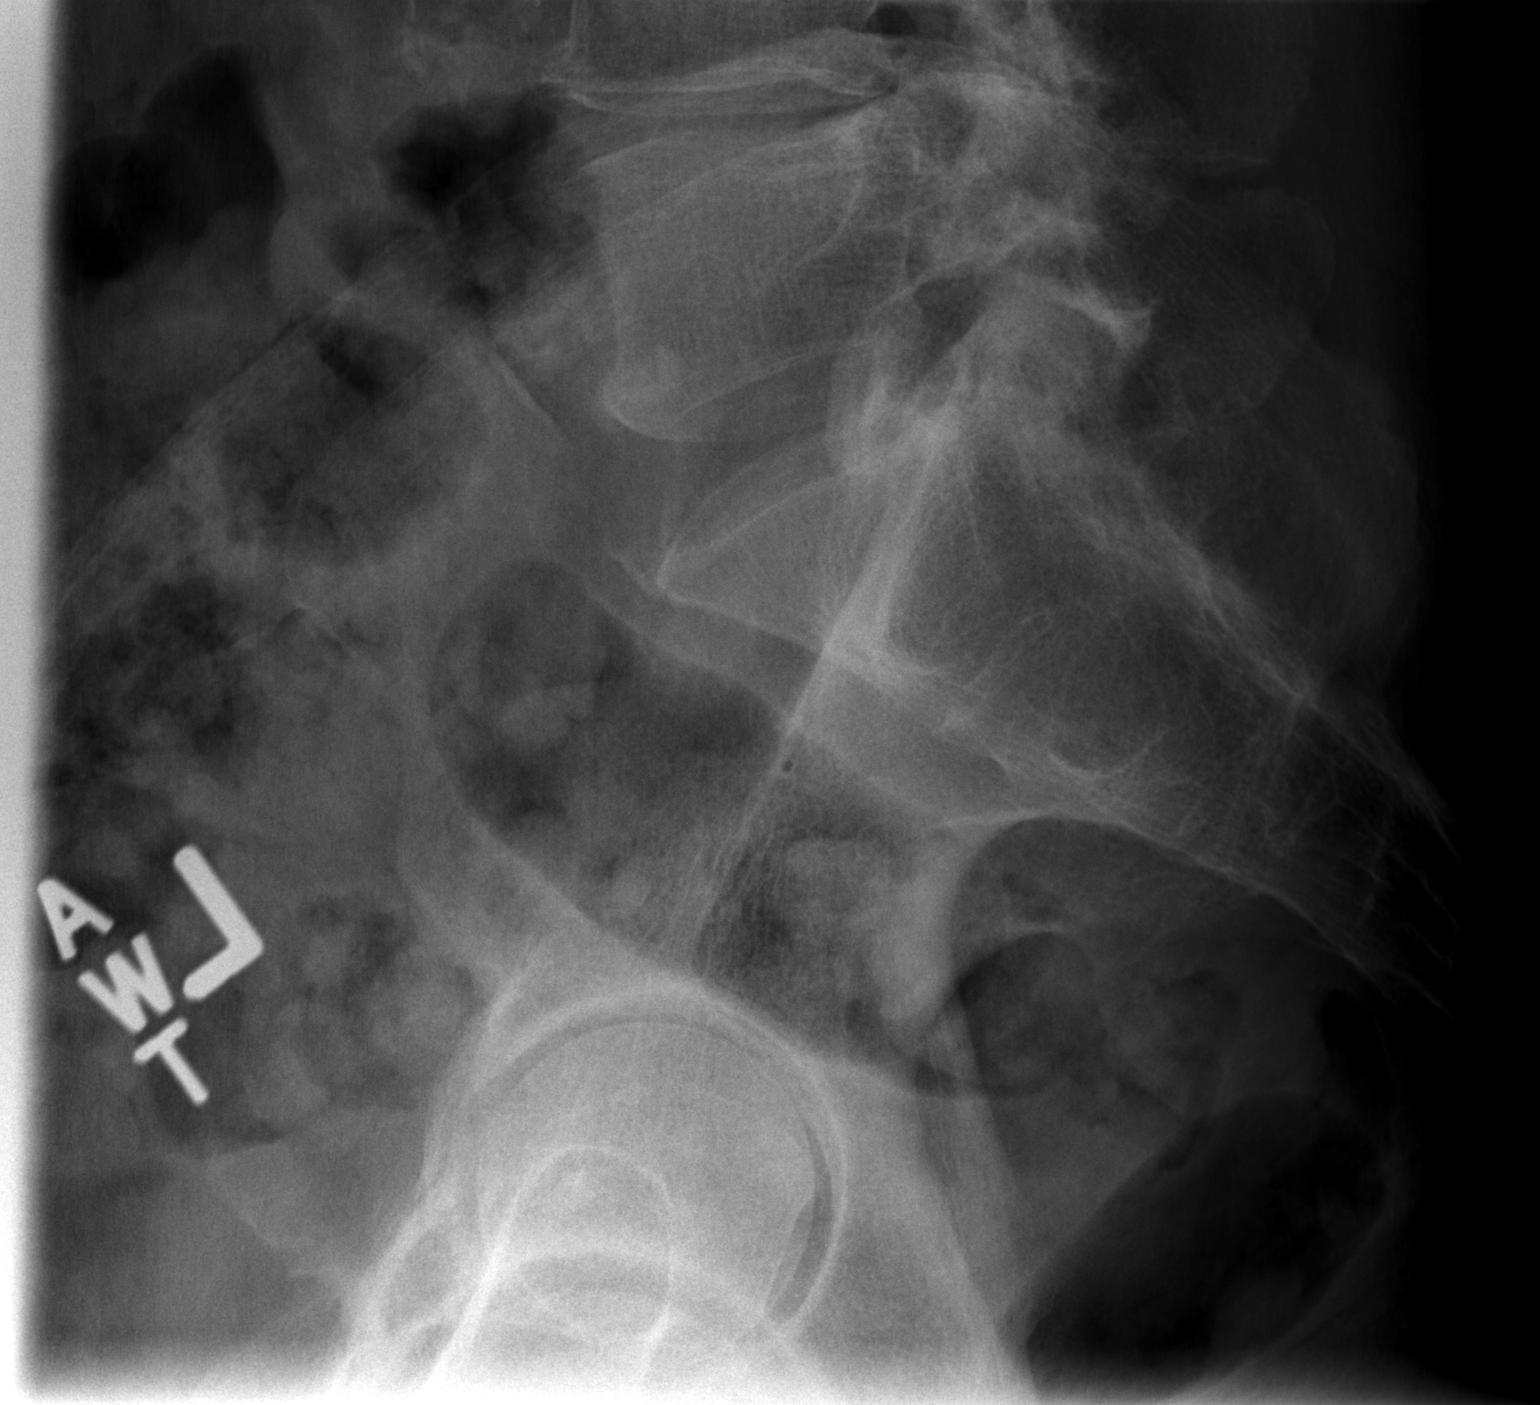

[3 of 3 positions shown; findings below may reference images not displayed]

FINDINGS: Normal alignment of the lumbar vertebral bodies.  Mild to
moderate degenerative disc disease and facet disease.  No acute
bony findings or destructive bony changes.  There are five non-rib
bearing lumbar type vertebral bodies.  SI joint degenerative
changes are noted.
IMPRESSION: Normal alignment and no acute bony findings
Mild multilevel disc disease and facet disease.

## 2015-02-10 NOTE — Progress Notes (Signed)
Please put orders in Epic surgery 02-20-15 pre op 02-17-15 Thanks

## 2015-02-12 ENCOUNTER — Ambulatory Visit: Payer: Self-pay | Admitting: General Surgery

## 2015-02-12 NOTE — H&P (Signed)
Derrick DelayJames [name] 01/26/2015 2:51 PM Location: [facility] Patient #: [id number] DOB: 01/04/1956 Married / Language: [place] / Race: White Male  History of Present Illness Derrick [name](Kamryn Gauthier M. Ezequiel Macauley MD; 01/26/2015 4:42 PM) Patient words: abd pain.  The patient is a 59 year old male who presents with inguinal swelling. He called the office earlier today complaining of a hernia in his right groin with intermittent nausea and vomiting. I was able to work him into my office today. We initially referred him to the emergency room but he declined. I had previously repaired a left inguinal hernia on him laparoscopically in 2013. He states he noticed a bulge in his right groin about 3 weeks ago. It is more bothersome if he has been standing up for prolonged period of time. He states he has been out of work since March 1. He states he has had several episodes of nausea and vomiting however the hernia has not been firm. It reduces when he lays down. He denies any fevers or chills. He does have baseline constipation. He denies any dysuria. However he does have nocturia and hesitancy and frequency. He denies any chest pain, chest pressure, shortness of breath, dyspnea on exertion, TIAs or amaurosis fugax. He does smoke 1 pack per day.  I reviewed his CT scan of his pelvis that was done a short time ago on 327 that confirmed a right inguinal hernia that contained a few loops of small bowel without any sign of incarceration or strangulation   Other Problems (Ammie Eversole, LPN; 4/40/34743/14/2016 2:592:51 PM) Back Pain Diabetes Mellitus Enlarged Prostate Inguinal Hernia  Diagnostic Studies History (Ammie Eversole, LPN; 5/63/87563/14/2016 4:332:51 PM) Colonoscopy 1-5 years ago  Allergies (Ammie Eversole, LPN; 2/95/18843/14/2016 1:663:03 PM) NSAIDs  Medication History (Ammie Eversole, LPN; 0/63/01603/14/2016 1:093:04 PM) Glucovance (5-500MG  Tablet, Oral) Active. Robaxin (500MG  Tablet, Oral) Active. Percocet (5-325MG  Tablet, Oral)  Active.  Social History (Ammie Eversole, LPN; 3/23/55733/14/2016 2:202:51 PM) Caffeine use Carbonated beverages, Coffee. Tobacco use Current every day smoker.  Family History Deon Pilling(Ammie Eversole, LPN; 2/54/27063/14/2016 2:372:51 PM) Breast Cancer Mother. Diabetes Mellitus Brother, Father, Mother, Sister. Hypertension Mother.  Review of Systems (Ammie Eversole LPN; 6/28/31513/14/2016 7:612:51 PM) General Present- Appetite Loss and Fatigue. Not Present- Chills, Fever, Night Sweats, Weight Gain and Weight Loss. Skin Not Present- Change in Wart/Mole, Dryness, Hives, Jaundice, New Lesions, Non-Healing Wounds, Rash and Ulcer. HEENT Not Present- Earache, Hearing Loss, Hoarseness, Nose Bleed, Oral Ulcers, Ringing in the Ears, Seasonal Allergies, Sinus Pain, Sore Throat, Visual Disturbances, Wears glasses/contact lenses and Yellow Eyes. Respiratory Not Present- Bloody sputum, Chronic Cough, Difficulty Breathing, Snoring and Wheezing. Breast Not Present- Breast Mass, Breast Pain, Nipple Discharge and Skin Changes. Cardiovascular Not Present- Chest Pain, Difficulty Breathing Lying Down, Leg Cramps, Palpitations, Rapid Heart Rate, Shortness of Breath and Swelling of Extremities. Gastrointestinal Present- Abdominal Pain, Bloating and Nausea. Not Present- Bloody Stool, Change in Bowel Habits, Chronic diarrhea, Constipation, Difficulty Swallowing, Excessive gas, Gets full quickly at meals, Hemorrhoids, Indigestion, Rectal Pain and Vomiting. Male Genitourinary Present- Change in Urinary Stream and Painful Urination. Not Present- Blood in Urine, Frequency, Impotence, Nocturia, Urgency and Urine Leakage. Musculoskeletal Present- Back Pain. Not Present- Joint Pain, Joint Stiffness, Muscle Pain, Muscle Weakness and Swelling of Extremities. Neurological Present- Trouble walking. Not Present- Decreased Memory, Fainting, Headaches, Numbness, Seizures, Tingling, Tremor and Weakness. Psychiatric Present- Anxiety. Not Present- Bipolar, Change in Sleep  Pattern, Depression, Fearful and Frequent crying. Endocrine Not Present- Cold Intolerance, Excessive Hunger, Hair Changes, Heat Intolerance, Hot flashes and New  Diabetes. Hematology Not Present- Easy Bruising, Excessive bleeding, Gland problems, HIV and Persistent Infections.   Vitals (Ammie Eversole LPN; 6/96/29523/14/2016 8:412:52 PM) 01/26/2015 2:51 PM Weight: 172 lb Height: 75in Body Surface Area: 2.03 m Body Mass Index: 21.5 kg/m Pulse: 82 (Regular)  BP: 126/82 (Sitting, Left Arm, Standard)    Physical Exam Derrick [name](Raliyah Montella M. Romell Wolden MD; 01/26/2015 4:37 PM) General Mental Status-Alert. General Appearance-Consistent with stated age. Hydration-Well hydrated. Voice-Normal. Note: appears older than stated age   Head and Neck Head-normocephalic, atraumatic with no lesions or palpable masses. Trachea-midline. Thyroid Gland Characteristics - normal size and consistency.  Eye Eyeball - Bilateral-Extraocular movements intact. Sclera/Conjunctiva - Bilateral-No scleral icterus.  Chest and Lung Exam Chest and lung exam reveals -quiet, even and easy respiratory effort with no use of accessory muscles and on auscultation, normal breath sounds, no adventitious sounds and normal vocal resonance. Inspection Chest Wall - Normal. Back - normal.  Breast - Did not examine.  Cardiovascular Cardiovascular examination reveals -normal heart sounds, regular rate and rhythm with no murmurs and normal pedal pulses bilaterally.  Abdomen Inspection Inspection of the abdomen reveals - No Hernias. Skin - Scar - no surgical scars. Palpation/Percussion Palpation and Percussion of the abdomen reveal - Soft, Non Tender, No Rebound tenderness, No Rigidity (guarding) and No hepatosplenomegaly. Auscultation Auscultation of the abdomen reveals - Bowel sounds normal.  Male Genitourinary Note: pt examined supine and standing with and without valsalva. no obvious bulge lying down. on standing,  bulge in right groin. reducible. not terribly tender. bulge is medial. no left inguinal bulge. both testicles down; no scrotal masses.   Peripheral Vascular Upper Extremity Palpation - Pulses bilaterally normal.  Neurologic Neurologic evaluation reveals -alert and oriented x 3 with no impairment of recent or remote memory. Mental Status-Normal.  Neuropsychiatric The patient's mood and affect are described as -normal. Judgment and Insight-insight is appropriate concerning matters relevant to self.  Musculoskeletal Normal Exam - Left-Upper Extremity Strength Normal and Lower Extremity Strength Normal. Normal Exam - Right-Upper Extremity Strength Normal and Lower Extremity Strength Normal.  Lymphatic Head & Neck  General Head & Neck Lymphatics: Bilateral - Description - Normal. Axillary - Did not examine. Femoral & Inguinal - Did not examine.    Assessment & Plan Derrick [name](Jalexis Breed M. Aleane Wesenberg MD; 01/26/2015 4:41 PM) RIGHT INGUINAL HERNIA (550.90  K40.90) Current Plans  We discussed the etiology of inguinal hernias. We discussed the signs & symptoms of incarceration & strangulation. We discussed non-operative and operative management. We also discussed open and laparoscopic approaches.  I described laparoscopic inguinal hernia repair procedure in detail. The patient was given educational material. We discussed the risks and benefits including but not limited to bleeding, infection, chronic inguinal pain, nerve entrapment, hernia recurrence, mesh complications, hematoma formation, urinary retention, injury to the testicles, numbness in the groin, blood clots, injury to the surrounding structures, and anesthesia risk. We also discussed the typical post operative recovery course, including no heavy lifting for 4-6 weeks. I explained that the likelihood of improvement of their symptoms is good  Discussed he is at higher risk for infection and recurrence because of his smoking  use Instructions:  Pick up flomax from pharmacy and start taking 2 weeks before surgery including morning of surgery; we will put him on Flomax perioperatively to try to decrease his risk of postoperative urinary retention take miralax daily for constipation Schedule for Surgery Started Flomax 0.4MG , 1 (one) Capsule daily, #21, 21 days starting 01/26/2015, No Refill. Started OxyCODONE HCl 5MG , 1 (one)  Tablet every four hours, as needed, #40, 01/26/2015, No Refill. TOBACCO ABUSE (305.1  Z72.0) CONTROLLED TYPE 2 DIABETES MELLITUS WITHOUT COMPLICATION (250.00  E11.9)  Mary Sella. Andrey Campanile, MD, FACS General, Bariatric, & Minimally Invasive Surgery [facility], [place]

## 2015-02-16 NOTE — Patient Instructions (Addendum)
Derrick DelayJames [name]  02/16/2015   Your procedure is scheduled on: 02/20/15   [facility] and follow signs to               Short Stay Center at 9:30  AM.   Call this number if you have problems the morning of surgery [phone]   Remember:  Do not eat food or drink liquids :After Midnight.    Take these medicines the morning of surgery with A SIP OF WATER: FLOMAX              STOP ASPIRIN / HERBAL MEDS / VITAMINS / IBUPROFEN 5 DAYS PREOP                               You may not have any metal on your body including hair pins and              piercings  Do not wear jewelry, make-up, lotions, powders or perfumes.             Do not wear nail polish.  Do not shave  48 hours prior to surgery.              Men may shave face and neck.   Do not bring valuables to the hospital. [facility] IS NOT             RESPONSIBLE   FOR VALUABLES.  Contacts, dentures or bridgework may not be worn into surgery.  Leave suitcase in the car. After surgery it may be brought to your room.     Patients discharged the day of surgery will not be allowed to drive home.  Name and phone number of your driver:  Special Instructions: N/A              Please read over the following fact sheets you were given: _____________________________________________________________________                                                     [place] - PREPARING FOR SURGERY  Before surgery, you can play an important role.  Because skin is not sterile, your skin needs to be as free of germs as possible.  You can reduce the number of germs on your skin by washing with CHG (chlorahexidine gluconate) soap before surgery.  CHG is an antiseptic cleaner which kills germs and bonds with the skin to continue killing germs even after washing. Please DO NOT use if you have an allergy to CHG or antibacterial soaps.  If your skin becomes reddened/irritated stop using the CHG and inform  your nurse when you arrive at Short Stay. Do not shave (including legs and underarms) for at least 48 hours prior to the first CHG shower.  You may shave your face. Please follow these instructions carefully:   1.  Shower with CHG Soap the night before surgery and the  morning of Surgery.   2.  If you choose to wash your hair, wash your hair first as usual with your  normal  Shampoo.   3.  After you shampoo, rinse your hair and body thoroughly to remove the  shampoo.  4.  Use CHG as you would any other liquid soap.  You can apply chg directly  to the skin and wash . Gently wash with scrungie or clean wascloth    5.  Apply the CHG Soap to your body ONLY FROM THE NECK DOWN.   Do not use on open                           Wound or open sores. Avoid contact with eyes, ears mouth and genitals (private parts).                        Genitals (private parts) with your normal soap.              6.  Wash thoroughly, paying special attention to the area where your surgery  will be performed.   7.  Thoroughly rinse your body with warm water from the neck down.   8.  DO NOT shower/wash with your normal soap after using and rinsing off  the CHG Soap .                9.  Pat yourself dry with a clean towel.             10.  Wear clean pajamas.             11.  Place clean sheets on your bed the night of your first shower and do not  sleep with pets.  Day of Surgery : Do not apply any lotions/deodorants the morning of surgery.  Please wear clean clothes to the hospital/surgery center.  FAILURE TO FOLLOW THESE INSTRUCTIONS MAY RESULT IN THE CANCELLATION OF YOUR SURGERY    PATIENT SIGNATURE_________________________________  ______________________________________________________________________

## 2015-02-17 ENCOUNTER — Encounter (HOSPITAL_COMMUNITY)
Admission: RE | Admit: 2015-02-17 | Discharge: 2015-02-17 | Disposition: A | Discharge status: Home / Self Care | Payer: 59 | Source: Ambulatory Visit | Attending: General Surgery | Admitting: General Surgery

## 2015-02-17 ENCOUNTER — Encounter (HOSPITAL_COMMUNITY): Payer: Self-pay

## 2015-02-17 DIAGNOSIS — E119 Type 2 diabetes mellitus without complications: Secondary | ICD-10-CM | POA: Diagnosis not present

## 2015-02-17 DIAGNOSIS — F419 Anxiety disorder, unspecified: Secondary | ICD-10-CM | POA: Diagnosis not present

## 2015-02-17 DIAGNOSIS — Z79899 Other long term (current) drug therapy: Secondary | ICD-10-CM | POA: Diagnosis not present

## 2015-02-17 DIAGNOSIS — K4090 Unilateral inguinal hernia, without obstruction or gangrene, not specified as recurrent: Secondary | ICD-10-CM | POA: Diagnosis present

## 2015-02-17 DIAGNOSIS — Z79891 Long term (current) use of opiate analgesic: Secondary | ICD-10-CM | POA: Diagnosis not present

## 2015-02-17 DIAGNOSIS — F17200 Nicotine dependence, unspecified, uncomplicated: Secondary | ICD-10-CM | POA: Diagnosis not present

## 2015-02-17 HISTORY — DX: Benign prostatic hyperplasia without lower urinary tract symptoms: N40.0

## 2015-02-17 HISTORY — DX: Sleep disorder, unspecified: G47.9

## 2015-02-17 LAB — BASIC METABOLIC PANEL
Anion gap: 14 (ref 5–15)
BUN: 19 mg/dL (ref 6–23)
CO2: 25 mmol/L (ref 19–32)
CREATININE: 0.77 mg/dL (ref 0.50–1.35)
Calcium: 9.5 mg/dL (ref 8.4–10.5)
Chloride: 96 mmol/L (ref 96–112)
GFR calc non Af Amer: >90 mL/min (ref >90)
GLUCOSE: 344 mg/dL — AB (ref 70–99)
Potassium: 4.5 mmol/L (ref 3.5–5.1)
SODIUM: 135 mmol/L (ref 135–145)

## 2015-02-17 LAB — CBC WITH DIFFERENTIAL/PLATELET
BASOS PCT: 0 % (ref 0–1)
Basophils Absolute: 0 10*3/uL (ref 0.0–0.1)
EOS ABS: 0.3 10*3/uL (ref 0.0–0.7)
EOS PCT: 2 % (ref 0–5)
HCT: 48.2 % (ref 39.0–52.0)
Hemoglobin: 16.6 g/dL (ref 13.0–17.0)
LYMPHS ABS: 1.1 10*3/uL (ref 0.7–4.0)
Lymphocytes Relative: 11 % — ABNORMAL LOW (ref 12–46)
MCH: 30.9 pg (ref 26.0–34.0)
MCHC: 34.4 g/dL (ref 30.0–36.0)
MCV: 89.8 fL (ref 78.0–100.0)
MONO ABS: 0.6 10*3/uL (ref 0.1–1.0)
MONOS PCT: 6 % (ref 3–12)
NEUTROS PCT: 81 % — AB (ref 43–77)
Neutro Abs: 8.8 10*3/uL — ABNORMAL HIGH (ref 1.7–7.7)
Platelets: 126 10*3/uL — ABNORMAL LOW (ref 150–400)
RBC: 5.37 MIL/uL (ref 4.22–5.81)
RDW: 13.1 % (ref 11.5–15.5)
WBC: 10.9 10*3/uL — AB (ref 4.0–10.5)

## 2015-02-17 NOTE — Progress Notes (Signed)
Abnormal CBC / BMET [facility]Wilson [facility] and Fax

## 2015-02-19 NOTE — Progress Notes (Signed)
Spoke with pt by phone, pt aware surgery time changed to 1100 am, arrive 900am wl shorts stay, npo after midnight.

## 2015-02-20 ENCOUNTER — Ambulatory Visit (HOSPITAL_COMMUNITY)
Admission: RE | Admit: 2015-02-20 | Discharge: 2015-02-20 | Disposition: A | Discharge status: Home / Self Care | Payer: 59 | Source: Ambulatory Visit | Attending: General Surgery | Admitting: General Surgery

## 2015-02-20 ENCOUNTER — Encounter (HOSPITAL_COMMUNITY)
Admission: RE | Disposition: A | Discharge status: Home / Self Care | Payer: Self-pay | Source: Ambulatory Visit | Attending: General Surgery

## 2015-02-20 ENCOUNTER — Ambulatory Visit (HOSPITAL_COMMUNITY): Payer: 59 | Admitting: Anesthesiology

## 2015-02-20 ENCOUNTER — Encounter (HOSPITAL_COMMUNITY): Payer: Self-pay | Admitting: *Deleted

## 2015-02-20 DIAGNOSIS — F17200 Nicotine dependence, unspecified, uncomplicated: Secondary | ICD-10-CM | POA: Insufficient documentation

## 2015-02-20 DIAGNOSIS — Z79899 Other long term (current) drug therapy: Secondary | ICD-10-CM | POA: Insufficient documentation

## 2015-02-20 DIAGNOSIS — K4090 Unilateral inguinal hernia, without obstruction or gangrene, not specified as recurrent: Secondary | ICD-10-CM | POA: Insufficient documentation

## 2015-02-20 DIAGNOSIS — E119 Type 2 diabetes mellitus without complications: Secondary | ICD-10-CM | POA: Insufficient documentation

## 2015-02-20 DIAGNOSIS — F419 Anxiety disorder, unspecified: Secondary | ICD-10-CM | POA: Insufficient documentation

## 2015-02-20 DIAGNOSIS — Z79891 Long term (current) use of opiate analgesic: Secondary | ICD-10-CM | POA: Insufficient documentation

## 2015-02-20 HISTORY — PX: INGUINAL HERNIA REPAIR: SHX194

## 2015-02-20 HISTORY — PX: INSERTION OF MESH: SHX5868

## 2015-02-20 LAB — GLUCOSE, CAPILLARY
GLUCOSE-CAPILLARY: 233 mg/dL — AB (ref 70–99)
GLUCOSE-CAPILLARY: 258 mg/dL — AB (ref 70–99)

## 2015-02-20 SURGERY — REPAIR, HERNIA, INGUINAL, LAPAROSCOPIC
Anesthesia: General | Site: Groin | Laterality: Right

## 2015-02-20 MED ORDER — GLYCOPYRROLATE 0.2 MG/ML IJ SOLN
INTRAMUSCULAR | Status: AC
  Filled 2015-02-20: qty 3

## 2015-02-20 MED ORDER — NEOSTIGMINE METHYLSULFATE 10 MG/10ML IV SOLN
INTRAVENOUS | Status: DC | PRN
  Administered 2015-02-20: 4 mg via INTRAVENOUS

## 2015-02-20 MED ORDER — CEFAZOLIN SODIUM-DEXTROSE 2-3 GM-% IV SOLR
INTRAVENOUS | Status: AC
  Filled 2015-02-20: qty 50

## 2015-02-20 MED ORDER — NEOSTIGMINE METHYLSULFATE 10 MG/10ML IV SOLN
INTRAVENOUS | Status: AC
  Filled 2015-02-20: qty 1

## 2015-02-20 MED ORDER — CHLORHEXIDINE GLUCONATE 4 % EX LIQD: 1.0000 "application " | Freq: Once | CUTANEOUS | Status: DC

## 2015-02-20 MED ORDER — LACTATED RINGERS IV SOLN
INTRAVENOUS | Status: DC
  Administered 2015-02-20: 1000 mL via INTRAVENOUS

## 2015-02-20 MED ORDER — FENTANYL CITRATE 0.05 MG/ML IJ SOLN
INTRAMUSCULAR | Status: AC
  Filled 2015-02-20: qty 5

## 2015-02-20 MED ORDER — CEFAZOLIN SODIUM-DEXTROSE 2-3 GM-% IV SOLR
2.0000 g | INTRAVENOUS | Status: AC
  Administered 2015-02-20: 2 g via INTRAVENOUS

## 2015-02-20 MED ORDER — SODIUM CHLORIDE 0.9 % IV SOLN: 20.0000 mL | Freq: Once | INTRAVENOUS | Status: DC

## 2015-02-20 MED ORDER — KETOROLAC TROMETHAMINE 30 MG/ML IJ SOLN
INTRAMUSCULAR | Status: DC | PRN
  Administered 2015-02-20: 30 mg via INTRAVENOUS

## 2015-02-20 MED ORDER — BUPIVACAINE-EPINEPHRINE 0.25% -1:200000 IJ SOLN
INTRAMUSCULAR | Status: DC | PRN
  Administered 2015-02-20: 8 mL

## 2015-02-20 MED ORDER — KETOROLAC TROMETHAMINE 30 MG/ML IJ SOLN
INTRAMUSCULAR | Status: AC
  Filled 2015-02-20: qty 1

## 2015-02-20 MED ORDER — HYDROMORPHONE HCL 1 MG/ML IJ SOLN
INTRAMUSCULAR | Status: DC | PRN
  Administered 2015-02-20: 1 mg via INTRAVENOUS
  Administered 2015-02-20 (×2): 0.5 mg via INTRAVENOUS

## 2015-02-20 MED ORDER — SODIUM CHLORIDE 0.9 % IJ SOLN
INTRAMUSCULAR | Status: AC
  Filled 2015-02-20: qty 30

## 2015-02-20 MED ORDER — HYDROMORPHONE HCL 1 MG/ML IJ SOLN: 0.2500 mg | INTRAMUSCULAR | Status: DC | PRN

## 2015-02-20 MED ORDER — BUPIVACAINE LIPOSOME 1.3 % IJ SUSP
20.0000 mL | Freq: Once | INTRAMUSCULAR | Status: AC
  Administered 2015-02-20: 20 mL
  Filled 2015-02-20: qty 20

## 2015-02-20 MED ORDER — FENTANYL CITRATE 0.05 MG/ML IJ SOLN
INTRAMUSCULAR | Status: DC | PRN
  Administered 2015-02-20: 50 ug via INTRAVENOUS
  Administered 2015-02-20: 100 ug via INTRAVENOUS
  Administered 2015-02-20 (×2): 50 ug via INTRAVENOUS

## 2015-02-20 MED ORDER — MIDAZOLAM HCL 5 MG/5ML IJ SOLN
INTRAMUSCULAR | Status: DC | PRN
  Administered 2015-02-20: 2 mg via INTRAVENOUS

## 2015-02-20 MED ORDER — LIDOCAINE HCL (CARDIAC) 20 MG/ML IV SOLN
INTRAVENOUS | Status: DC | PRN
  Administered 2015-02-20: 50 mg via INTRAVENOUS

## 2015-02-20 MED ORDER — SUCCINYLCHOLINE CHLORIDE 20 MG/ML IJ SOLN
INTRAMUSCULAR | Status: DC | PRN
  Administered 2015-02-20: 100 mg via INTRAVENOUS

## 2015-02-20 MED ORDER — ONDANSETRON HCL 4 MG/2ML IJ SOLN
INTRAMUSCULAR | Status: DC | PRN
  Administered 2015-02-20: 4 mg via INTRAVENOUS

## 2015-02-20 MED ORDER — KETOROLAC TROMETHAMINE 30 MG/ML IJ SOLN: INTRAMUSCULAR | Status: DC | PRN

## 2015-02-20 MED ORDER — 0.9 % SODIUM CHLORIDE (POUR BTL) OPTIME
TOPICAL | Status: DC | PRN
  Administered 2015-02-20: 1000 mL

## 2015-02-20 MED ORDER — PROPOFOL 10 MG/ML IV BOLUS
INTRAVENOUS | Status: AC
  Filled 2015-02-20: qty 20

## 2015-02-20 MED ORDER — PROPOFOL 10 MG/ML IV BOLUS
INTRAVENOUS | Status: DC | PRN
  Administered 2015-02-20: 180 mg via INTRAVENOUS

## 2015-02-20 MED ORDER — MIDAZOLAM HCL 2 MG/2ML IJ SOLN
INTRAMUSCULAR | Status: AC
  Filled 2015-02-20: qty 2

## 2015-02-20 MED ORDER — LIDOCAINE HCL (CARDIAC) 20 MG/ML IV SOLN
INTRAVENOUS | Status: AC
  Filled 2015-02-20: qty 5

## 2015-02-20 MED ORDER — HYDROMORPHONE HCL 2 MG/ML IJ SOLN
INTRAMUSCULAR | Status: AC
  Filled 2015-02-20: qty 1

## 2015-02-20 MED ORDER — ROCURONIUM BROMIDE 100 MG/10ML IV SOLN
INTRAVENOUS | Status: AC
  Filled 2015-02-20: qty 1

## 2015-02-20 MED ORDER — ROCURONIUM BROMIDE 100 MG/10ML IV SOLN
INTRAVENOUS | Status: DC | PRN
  Administered 2015-02-20: 10 mg via INTRAVENOUS
  Administered 2015-02-20: 5 mg via INTRAVENOUS
  Administered 2015-02-20: 35 mg via INTRAVENOUS
  Administered 2015-02-20: 10 mg via INTRAVENOUS

## 2015-02-20 MED ORDER — GLYCOPYRROLATE 0.2 MG/ML IJ SOLN
INTRAMUSCULAR | Status: DC | PRN
  Administered 2015-02-20: 0.6 mg via INTRAVENOUS

## 2015-02-20 MED ORDER — OXYCODONE-ACETAMINOPHEN 5-325 MG PO TABS: 1.0000 | ORAL_TABLET | ORAL | Status: DC | PRN

## 2015-02-20 MED ORDER — TAMSULOSIN HCL 0.4 MG PO CAPS: 0.4000 mg | ORAL_CAPSULE | Freq: Every day | ORAL | Status: AC

## 2015-02-20 MED ORDER — LACTATED RINGERS IR SOLN
Status: DC | PRN
  Administered 2015-02-20: 1000 mL

## 2015-02-20 MED ORDER — BUPIVACAINE-EPINEPHRINE (PF) 0.25% -1:200000 IJ SOLN
INTRAMUSCULAR | Status: AC
  Filled 2015-02-20: qty 30

## 2015-02-20 SURGICAL SUPPLY — 35 items
BANDAGE ADH SHEER 1  50/CT (GAUZE/BANDAGES/DRESSINGS) IMPLANT
BENZOIN TINCTURE PRP APPL 2/3 (GAUZE/BANDAGES/DRESSINGS) IMPLANT
CLOSURE WOUND 1/2 X4 (GAUZE/BANDAGES/DRESSINGS)
DECANTER SPIKE VIAL GLASS SM (MISCELLANEOUS) ×3 IMPLANT
DEVICE SECURE STRAP 25 ABSORB (INSTRUMENTS) ×3 IMPLANT
DRAPE LAPAROSCOPIC ABDOMINAL (DRAPES) ×3 IMPLANT
DRSG TEGADERM 2-3/8X2-3/4 SM (GAUZE/BANDAGES/DRESSINGS) IMPLANT
DRSG TEGADERM 4X4.75 (GAUZE/BANDAGES/DRESSINGS) IMPLANT
ELECT REM PT RETURN 9FT ADLT (ELECTROSURGICAL) ×3
ELECTRODE REM PT RTRN 9FT ADLT (ELECTROSURGICAL) ×1 IMPLANT
GLOVE BIO SURGEON STRL SZ7.5 (GLOVE) ×3 IMPLANT
GLOVE BIOGEL M STRL SZ7.5 (GLOVE) IMPLANT
GLOVE INDICATOR 8.0 STRL GRN (GLOVE) ×3 IMPLANT
GOWN STRL REUS W/TWL XL LVL3 (GOWN DISPOSABLE) ×9 IMPLANT
KIT BASIN OR (CUSTOM PROCEDURE TRAY) ×3 IMPLANT
LIQUID BAND (GAUZE/BANDAGES/DRESSINGS) ×3 IMPLANT
MESH ULTRAPRO 3X6 7.6X15CM (Mesh General) ×3 IMPLANT
NS IRRIG 1000ML POUR BTL (IV SOLUTION) ×3 IMPLANT
RELOAD STAPLE HERNIA 4.0 BLUE (INSTRUMENTS) IMPLANT
RELOAD STAPLE HERNIA 4.8 BLK (STAPLE) IMPLANT
SCALPEL HARMONIC ACE (MISCELLANEOUS) IMPLANT
SCISSORS LAP 5X35 DISP (ENDOMECHANICALS) IMPLANT
SET IRRIG TUBING LAPAROSCOPIC (IRRIGATION / IRRIGATOR) IMPLANT
SOLUTION ANTI FOG 6CC (MISCELLANEOUS) IMPLANT
STAPLER HERNIA 12 8.5 360D (INSTRUMENTS) IMPLANT
STRIP CLOSURE SKIN 1/2X4 (GAUZE/BANDAGES/DRESSINGS) IMPLANT
SUT MNCRL AB 4-0 PS2 18 (SUTURE) ×6 IMPLANT
SUT VICRYL 0 UR6 27IN ABS (SUTURE) ×3 IMPLANT
TOWEL OR 17X26 10 PK STRL BLUE (TOWEL DISPOSABLE) ×3 IMPLANT
TOWEL OR NON WOVEN STRL DISP B (DISPOSABLE) ×3 IMPLANT
TRAY LAPAROSCOPIC (CUSTOM PROCEDURE TRAY) ×3 IMPLANT
TROCAR BLADELESS OPT 5 75 (ENDOMECHANICALS) ×3 IMPLANT
TROCAR SLEEVE XCEL 5X75 (ENDOMECHANICALS) ×3 IMPLANT
TROCAR XCEL BLUNT TIP 100MML (ENDOMECHANICALS) ×3 IMPLANT
TUBING INSUFFLATION 10FT LAP (TUBING) ×3 IMPLANT

## 2015-02-20 NOTE — Anesthesia Procedure Notes (Signed)
Procedure Name: Intubation Date/Time: 02/20/2015 11:17 AM Performed by: Enriqueta ShutterWILLIFORD, Oni Dietzman D Pre-anesthesia Checklist: Patient identified, Emergency Drugs available, Suction available and Patient being monitored Patient Re-evaluated:Patient Re-evaluated prior to inductionOxygen Delivery Method: Circle System Utilized Preoxygenation: Pre-oxygenation with 100% oxygen Intubation Type: IV induction Ventilation: Mask ventilation without difficulty Grade View: Grade II Tube type: Oral Tube size: 7.5 mm Number of attempts: 1 Airway Equipment and Method: Stylet and Oral airway Placement Confirmation: ETT inserted through vocal cords under direct vision,  positive ETCO2 and breath sounds checked- equal and bilateral Secured at: 22 cm Tube secured with: Tape Dental Injury: Teeth and Oropharynx as per pre-operative assessment

## 2015-02-20 NOTE — Interval H&P Note (Signed)
History and Physical Interval Note:  02/20/2015 11:03 AM  Derrick [name]  has presented today for surgery, with the diagnosis of Right Inguinal Hernia  The various methods of treatment have been discussed with the patient and family. After consideration of risks, benefits and other options for treatment, the patient has consented to  Procedure(s): LAPAROSCOPIC INGUINAL HERNIA REPAIR WITH MESH (Right) INSERTION OF MESH (Right) as a surgical intervention .  The patient's history has been reviewed, patient examined, no change in status, stable for surgery.  I have reviewed the patient's chart and labs.  Questions were answered to the patient's satisfaction.    Mary SellaEric M. Andrey CampanileWilson, MD, FACS General, Bariatric, & Minimally Invasive Surgery [facility], [place]    [facility],Sylvain Hasten M

## 2015-02-20 NOTE — Transfer of Care (Signed)
Immediate Anesthesia Transfer of Care Note  Patient: Derrick [name]  Procedure(s) Performed: Procedure(s): LAPAROSCOPIC INGUINAL HERNIA REPAIR WITH MESH (Right) INSERTION OF MESH (Right)  Patient Location: PACU  Anesthesia Type:General  Level of Consciousness: awake, alert  and oriented  Airway & Oxygen Therapy: Patient Spontanous Breathing and Patient connected to face mask oxygen  Post-op Assessment: Report given to RN and Post -op Vital signs reviewed and stable  Post vital signs: Reviewed and stable  Last Vitals:  Filed Vitals:   02/20/15 0854  BP: 130/78  Pulse: 94  Temp: 36.3 C  Resp: 16    Complications: No apparent anesthesia complications

## 2015-02-20 NOTE — Anesthesia Preprocedure Evaluation (Addendum)
Anesthesia Evaluation  Patient identified by MRN, date of birth, ID band Patient awake    Reviewed: Allergy & Precautions, H&P , NPO status , Patient's Chart, lab work & pertinent test results  Airway Mallampati: III  TM Distance: >3 FB Neck ROM: Full    Dental no notable dental hx. (+) Teeth Intact, Dental Advisory Given   Pulmonary Current Smoker,  breath sounds clear to auscultation  Pulmonary exam normal       Cardiovascular negative cardio ROS  Rhythm:Regular Rate:Normal     Neuro/Psych Anxiety negative neurological ROS  negative psych ROS   GI/Hepatic negative GI ROS, Neg liver ROS,   Endo/Other  diabetes, Type 2, Oral Hypoglycemic Agents  Renal/GU negative Renal ROS  negative genitourinary   Musculoskeletal   Abdominal   Peds  Hematology negative hematology ROS (+)   Anesthesia Other Findings   Reproductive/Obstetrics negative OB ROS                            Anesthesia Physical Anesthesia Plan  ASA: II  Anesthesia Plan: General   Post-op Pain Management:    Induction: Intravenous  Airway Management Planned: Oral ETT  Additional Equipment:   Intra-op Plan:   Post-operative Plan: Extubation in OR  Informed Consent: I have reviewed the patients History and Physical, chart, labs and discussed the procedure including the risks, benefits and alternatives for the proposed anesthesia with the patient or authorized representative who has indicated his/her understanding and acceptance.   Dental advisory given  Plan Discussed with: CRNA  Anesthesia Plan Comments:         Anesthesia Quick Evaluation

## 2015-02-20 NOTE — Anesthesia Postprocedure Evaluation (Signed)
  Anesthesia Post-op Note  Patient: Derrick [name]  Procedure(s) Performed: Procedure(s): LAPAROSCOPIC INGUINAL HERNIA REPAIR WITH MESH (Right) INSERTION OF MESH (Right)  Patient Location: PACU  Anesthesia Type:General  Level of Consciousness: awake and alert   Airway and Oxygen Therapy: Patient Spontanous Breathing  Post-op Pain: none  Post-op Assessment: Post-op Vital signs reviewed, Patient's Cardiovascular Status Stable and Respiratory Function Stable  Post-op Vital Signs: Reviewed  Filed Vitals:   02/20/15 1323  BP: 132/71  Pulse: 88  Temp: 37.3 C  Resp:     Complications: No apparent anesthesia complications

## 2015-02-20 NOTE — H&P (View-Only) (Signed)
Lark Gowan 01/26/2015 2:51 PM Location: [facility] Patient #: [id number] DOB: 09/21/1956 Married / Language: English / Race: White Male  History of Present Illness (Summit Borchardt M. Albena Comes MD; 01/26/2015 4:42 PM) Patient words: abd pain.  The patient is a 59 year old male who presents with inguinal swelling. He called the office earlier today complaining of a hernia in his right groin with intermittent nausea and vomiting. I was able to work him into my office today. We initially referred him to the emergency room but he declined. I had previously repaired a left inguinal hernia on him laparoscopically in 2013. He states he noticed a bulge in his right groin about 3 weeks ago. It is more bothersome if he has been standing up for prolonged period of time. He states he has been out of work since March 1. He states he has had several episodes of nausea and vomiting however the hernia has not been firm. It reduces when he lays down. He denies any fevers or chills. He does have baseline constipation. He denies any dysuria. However he does have nocturia and hesitancy and frequency. He denies any chest pain, chest pressure, shortness of breath, dyspnea on exertion, TIAs or amaurosis fugax. He does smoke 1 pack per day.  I reviewed his CT scan of his pelvis that was done a short time ago on 327 that confirmed a right inguinal hernia that contained a few loops of small bowel without any sign of incarceration or strangulation   Other Problems (Ammie Eversole, LPN; 01/26/2015 2:51 PM) Back Pain Diabetes Mellitus Enlarged Prostate Inguinal Hernia  Diagnostic Studies History (Ammie Eversole, LPN; 01/26/2015 2:51 PM) Colonoscopy 1-5 years ago  Allergies (Ammie Eversole, LPN; 01/26/2015 3:03 PM) NSAIDs  Medication History (Ammie Eversole, LPN; 01/26/2015 3:04 PM) Glucovance (5-500MG Tablet, Oral) Active. Robaxin (500MG Tablet, Oral) Active. Percocet (5-325MG Tablet, Oral)  Active.  Social History (Ammie Eversole, LPN; 01/26/2015 2:51 PM) Caffeine use Carbonated beverages, Coffee. Tobacco use Current every day smoker.  Family History (Ammie Eversole, LPN; 01/26/2015 2:51 PM) Breast Cancer Mother. Diabetes Mellitus Brother, Father, Mother, Sister. Hypertension Mother.  Review of Systems (Ammie Eversole LPN; 01/26/2015 2:51 PM) General Present- Appetite Loss and Fatigue. Not Present- Chills, Fever, Night Sweats, Weight Gain and Weight Loss. Skin Not Present- Change in Wart/Mole, Dryness, Hives, Jaundice, New Lesions, Non-Healing Wounds, Rash and Ulcer. HEENT Not Present- Earache, Hearing Loss, Hoarseness, Nose Bleed, Oral Ulcers, Ringing in the Ears, Seasonal Allergies, Sinus Pain, Sore Throat, Visual Disturbances, Wears glasses/contact lenses and Yellow Eyes. Respiratory Not Present- Bloody sputum, Chronic Cough, Difficulty Breathing, Snoring and Wheezing. Breast Not Present- Breast Mass, Breast Pain, Nipple Discharge and Skin Changes. Cardiovascular Not Present- Chest Pain, Difficulty Breathing Lying Down, Leg Cramps, Palpitations, Rapid Heart Rate, Shortness of Breath and Swelling of Extremities. Gastrointestinal Present- Abdominal Pain, Bloating and Nausea. Not Present- Bloody Stool, Change in Bowel Habits, Chronic diarrhea, Constipation, Difficulty Swallowing, Excessive gas, Gets full quickly at meals, Hemorrhoids, Indigestion, Rectal Pain and Vomiting. Male Genitourinary Present- Change in Urinary Stream and Painful Urination. Not Present- Blood in Urine, Frequency, Impotence, Nocturia, Urgency and Urine Leakage. Musculoskeletal Present- Back Pain. Not Present- Joint Pain, Joint Stiffness, Muscle Pain, Muscle Weakness and Swelling of Extremities. Neurological Present- Trouble walking. Not Present- Decreased Memory, Fainting, Headaches, Numbness, Seizures, Tingling, Tremor and Weakness. Psychiatric Present- Anxiety. Not Present- Bipolar, Change in Sleep  Pattern, Depression, Fearful and Frequent crying. Endocrine Not Present- Cold Intolerance, Excessive Hunger, Hair Changes, Heat Intolerance, Hot flashes and New   Diabetes. Hematology Not Present- Easy Bruising, Excessive bleeding, Gland problems, HIV and Persistent Infections.   Vitals (Ammie Eversole LPN; 01/26/2015 2:52 PM) 01/26/2015 2:51 PM Weight: 172 lb Height: 75in Body Surface Area: 2.03 m Body Mass Index: 21.5 kg/m Pulse: 82 (Regular)  BP: 126/82 (Sitting, Left Arm, Standard)    Physical Exam (Rehanna Oloughlin M. Peg Fifer MD; 01/26/2015 4:37 PM) General Mental Status-Alert. General Appearance-Consistent with stated age. Hydration-Well hydrated. Voice-Normal. Note: appears older than stated age   Head and Neck Head-normocephalic, atraumatic with no lesions or palpable masses. Trachea-midline. Thyroid Gland Characteristics - normal size and consistency.  Eye Eyeball - Bilateral-Extraocular movements intact. Sclera/Conjunctiva - Bilateral-No scleral icterus.  Chest and Lung Exam Chest and lung exam reveals -quiet, even and easy respiratory effort with no use of accessory muscles and on auscultation, normal breath sounds, no adventitious sounds and normal vocal resonance. Inspection Chest Wall - Normal. Back - normal.  Breast - Did not examine.  Cardiovascular Cardiovascular examination reveals -normal heart sounds, regular rate and rhythm with no murmurs and normal pedal pulses bilaterally.  Abdomen Inspection Inspection of the abdomen reveals - No Hernias. Skin - Scar - no surgical scars. Palpation/Percussion Palpation and Percussion of the abdomen reveal - Soft, Non Tender, No Rebound tenderness, No Rigidity (guarding) and No hepatosplenomegaly. Auscultation Auscultation of the abdomen reveals - Bowel sounds normal.  Male Genitourinary Note: pt examined supine and standing with and without valsalva. no obvious bulge lying down. on standing,  bulge in right groin. reducible. not terribly tender. bulge is medial. no left inguinal bulge. both testicles down; no scrotal masses.   Peripheral Vascular Upper Extremity Palpation - Pulses bilaterally normal.  Neurologic Neurologic evaluation reveals -alert and oriented x 3 with no impairment of recent or remote memory. Mental Status-Normal.  Neuropsychiatric The patient's mood and affect are described as -normal. Judgment and Insight-insight is appropriate concerning matters relevant to self.  Musculoskeletal Normal Exam - Left-Upper Extremity Strength Normal and Lower Extremity Strength Normal. Normal Exam - Right-Upper Extremity Strength Normal and Lower Extremity Strength Normal.  Lymphatic Head & Neck  General Head & Neck Lymphatics: Bilateral - Description - Normal. Axillary - Did not examine. Femoral & Inguinal - Did not examine.    Assessment & Plan (Mamye Bolds M. Parke Jandreau MD; 01/26/2015 4:41 PM) RIGHT INGUINAL HERNIA (550.90  K40.90) Current Plans  We discussed the etiology of inguinal hernias. We discussed the signs & symptoms of incarceration & strangulation. We discussed non-operative and operative management. We also discussed open and laparoscopic approaches.  I described laparoscopic inguinal hernia repair procedure in detail. The patient was given educational material. We discussed the risks and benefits including but not limited to bleeding, infection, chronic inguinal pain, nerve entrapment, hernia recurrence, mesh complications, hematoma formation, urinary retention, injury to the testicles, numbness in the groin, blood clots, injury to the surrounding structures, and anesthesia risk. We also discussed the typical post operative recovery course, including no heavy lifting for 4-6 weeks. I explained that the likelihood of improvement of their symptoms is good  Discussed he is at higher risk for infection and recurrence because of his smoking  use Instructions:  Pick up flomax from pharmacy and start taking 2 weeks before surgery including morning of surgery; we will put him on Flomax perioperatively to try to decrease his risk of postoperative urinary retention take miralax daily for constipation Schedule for Surgery Started Flomax 0.4MG, 1 (one) Capsule daily, #21, 21 days starting 01/26/2015, No Refill. Started OxyCODONE HCl 5MG, 1 (one)   Tablet every four hours, as needed, #40, 01/26/2015, No Refill. TOBACCO ABUSE (305.1  Z72.0) CONTROLLED TYPE 2 DIABETES MELLITUS WITHOUT COMPLICATION (250.00  E11.9)  Media Pizzini M. Arfa Lamarca, MD, FACS General, Bariatric, & Minimally Invasive Surgery [facility]  

## 2015-02-20 NOTE — Progress Notes (Signed)
Dr. Aleene DavidsonE Fitzgerald paged re: [phone]

## 2015-02-20 NOTE — Op Note (Signed)
02/20/2015  Derrick DelayJames Oscarson 01/11/1956   PREOPERATIVE DIAGNOSIS: right inguinal hernia.   POSTOPERATIVE DIAGNOSIS: right direct inguinal hernia.   PROCEDURE: Laparoscopic repair of right direct inguinal hernia with  mesh (TAPP).   SURGEON: Mary Sellaric M. Andrey CampanileWilson, MD FACS FASMBS  ASSISTANT SURGEON: None.   ANESTHESIA: General plus local consisting of 0.25% Marcaine with epi and 50cc exparel  ESTIMATED BLOOD LOSS: Minimal.   FINDINGS: The patient had a right direct inguinal hernia.  It was repaired using a 3 inch x 6  inch piece of Ethicon UltraPro mesh.   SPECIMEN: none  INDICATIONS FOR PROCEDURE: 59 yo WM presented with symptomatic right inguinal hernia who desired repair. I had previously repaired a left indirect inguinal hernia on him several years ago The risks and benefits including but not limited to bleeding, infection, chronic inguinal pain, nerve entrapment, hernia recurrence, mesh complications, hematoma formation, urinary retention, injury to the testicles or the ovaries, numbness in the groin, blood clots, injury to the surrounding structures, and anesthesia risk was discussed with the patient.  DESCRIPTION OF PROCEDURE: After obtaining verbal consent and marking  the right groin in the holding area with the patient confirming the  operative site, the patient was then taken back to the operating room, placed  supine on the operating room table. General endotracheal anesthesia was  established. The patient had emptied their bladder prior to going back to  the operating room. Sequential compression devices were placed. The  abdomen and groin were prepped and draped in the usual standard surgical  fashion with ChloraPrep. The patient received  IV antibiotics prior to the incision. A surgical time-out was performed.  Local was infiltrated at the base of the umbilicus.   Next, a 1-cm vertical infraumbilical incision was made with a #11 blade. The fascia  was grasped and lifted  anteriorly. Next, the fascia was incised, and  the abdominal cavity was entered. Pursestring suture was placed around  the fascial edges using a 0 Vicryl. A 12-mm Hasson trocar was placed.  Pneumoperitoneum was smoothly established up to a patient pressure of 15  mmHg. Laparoscope was advanced. There was no evidence of a  contralateral hernia. The mesh on the left side was visible and very well incorporated. The patient had a defect medial to  the inferior epigastric vessel, consistent with an right direct  hernia. Two 5-mm trocars were placed, one on the right, one on the left  in the midclavicular line in his old trocar scars slightly above the level of the umbilicus all  under direct visualization . After local had been infiltrated, I then  made incision along the peritoneum on the right, starting 2 inches above  the anterior superior iliac spine and caring it medial  toward the median umbilical ligament in a lazy S configuration using  Endo Shears with electrocautery. The peritoneal flap was then gently  dissected downward from the anterior abdominal wall taking care not to  injure the inferior epigastric vessels. The pubic bone was identified.  The testicular vessels were identified.  Using  traction and counter traction with short graspers, I reduced the plug of fat from the defect in  its entirety. The testicular vessels had been identified and preserved. The vas deferens was identified and preserved. I then went about creating a large pocket by  lifting the peritoneum of the pelvic floor. I took great care not to  injure the iliac vessels. Exparel anesthetic was injected 2 finger breadths below and medial to the anterior  superior iliac spine as well as along the right groin prior to placing the mesh. I then obtained a piece of Ethicon UltraPro mesh 3 inch x  6 inch, placed it through the Hasson trocar, half of it covered medial  to the inferior epigastric vessels and half of it  lateral to the  inferior epigastric vessels. The defect was well  covered with the mesh. I then secured the mesh to the abdominal wall  using an Ethicon secure strap tack. 1 Tack was placed through  the Cooper's ligament, one tack on each side of the inferior epigastric  vessel and two tacks out laterally and 1 superiorly medially. No tacks were placed below the  shelving edge of the inguinal ligament. Pneumoperitoneum was reduced  to 8 mmHg. I then brought the peritoneal flap back up to the abdominal  wall and tacked it to the abdominal wall using 4 tacks. There was no  defect in the peritoneum, and the mesh was well covered. I removed the  Hasson trocar and tied down the previously placed pursestring suture.  The closure was viewed laparoscopically. There was no evidence of  fascial defect. But I did place an additional 0 vicryl suture at the umbilical fascia. There was no air leak at the umbilicus. There was no  evidence of injury to surrounding structures.Additional exparel was infiltrated at the umbilicus.  Pneumoperitoneum was  released, and the remaining trocars were removed. All skin incisions  were closed with a 4-0 Monocryl in a subcuticular fashion followed by  application of Dermabond. All needle, instrument, and sponge counts  were correct x2. There are no immediate complications. The patient  tolerated the procedure well. The patient was extubated and taken to the  recovery room in stable condition.  Mary Sella. Andrey Campanile, MD, FACS General, Bariatric, & Minimally Invasive Surgery [facility], [place]

## 2015-02-20 NOTE — Discharge Instructions (Signed)
[facility]  UMBILICAL OR INGUINAL HERNIA REPAIR: POST OP INSTRUCTIONS  Always review your discharge instruction sheet given to you by the facility where your surgery was performed. IF YOU HAVE DISABILITY OR FAMILY LEAVE FORMS, YOU MUST BRING THEM TO THE OFFICE FOR PROCESSING.   DO NOT GIVE THEM TO YOUR DOCTOR.  1. A  prescription for pain medication may be given to you upon discharge.  Take your pain medication as prescribed, if needed.  If narcotic pain medicine is not needed, then you may take acetaminophen (Tylenol) or ibuprofen (Advil) as needed. 2. Take your usually prescribed medications unless otherwise directed. 3. If you need a refill on your pain medication, please contact your pharmacy.  They will contact our office to request authorization. Prescriptions will not be filled after 5 pm or on week-ends. 4. You should follow a light diet the first 24 hours after arrival home, such as soup and crackers, etc.  Be sure to include lots of fluids daily.  Resume your normal diet the day after surgery. 5. Most patients will experience some swelling and bruising around the umbilicus or in the groin and scrotum.  Ice packs and reclining will help.  Swelling and bruising can take several days to resolve.  6. It is common to experience some constipation if taking pain medication after surgery.  Increasing fluid intake and taking a stool softener (such as Colace) will usually help or prevent this problem from occurring.  A mild laxative (Milk of Magnesia or Miralax) should be taken according to package directions if there are no bowel movements after 48 hours. 7.  If your surgeon used skin glue on the incision, you may shower in 24 hours.  The glue will flake off over the next 2-3 weeks.  Any sutures or staples will be removed at the office during your follow-up visit. 8. ACTIVITIES:  You may resume regular (light) daily activities beginning the next day--such as daily self-care,  walking, climbing stairs--gradually increasing activities as tolerated.  You may have sexual intercourse when it is comfortable.  Refrain from any heavy lifting or straining until approved by your doctor. a. You may drive when you are no longer taking prescription pain medication, you can comfortably wear a seatbelt, and you can safely maneuver your car and apply brakes. b. RETURN TO WORK:  9. You should see your doctor in the office for a follow-up appointment approximately 2-3 weeks after your surgery.  Make sure that you call for this appointment within a day or two after you arrive home to insure a convenient appointment time. 10. OTHER INSTRUCTIONS: DO NOT LIFT, PUSH, OR PULL ANYTHING GREATER THAN 15 POUNDS FOR 4 WEEKS    WHEN TO CALL YOUR DOCTOR: 1. Fever over 101.0 2. Inability to urinate 3. Nausea and/or vomiting 4. Extreme swelling or bruising 5. Continued bleeding from incision. 6. Increased pain, redness, or drainage from the incision  The clinic staff is available to answer your questions during regular business hours.  Please dont hesitate to call and ask to speak to one of the nurses for clinical concerns.  If you have a medical emergency, go to the nearest emergency room or call 911.  A surgeon from [facility] is always on call at the hospital   [address], [address], [place], [place]  [address] ?  [address] [address], [place], [place]   [phone] ? [phone] ? FAX [phone]) [phone] Web site: [email or web address]

## 2015-02-23 ENCOUNTER — Encounter (HOSPITAL_COMMUNITY): Payer: Self-pay | Admitting: General Surgery

## 2015-05-14 ENCOUNTER — Other Ambulatory Visit: Payer: Self-pay | Admitting: General Surgery

## 2015-05-14 DIAGNOSIS — R1031 Right lower quadrant pain: Secondary | ICD-10-CM

## 2015-05-21 ENCOUNTER — Inpatient Hospital Stay: Admission: RE | Admit: 2015-05-21 | Payer: Self-pay | Source: Ambulatory Visit

## 2015-05-25 ENCOUNTER — Ambulatory Visit
Admission: RE | Admit: 2015-05-25 | Discharge: 2015-05-25 | Disposition: A | Discharge status: Home / Self Care | Payer: 59 | Source: Ambulatory Visit | Attending: General Surgery | Admitting: General Surgery

## 2015-05-25 ENCOUNTER — Other Ambulatory Visit: Payer: Self-pay

## 2015-05-25 MED ORDER — IOPAMIDOL (ISOVUE-300) INJECTION 61%
100.0000 mL | Freq: Once | INTRAVENOUS | Status: AC | PRN
  Administered 2015-05-25: 100 mL via INTRAVENOUS

## 2015-10-27 ENCOUNTER — Other Ambulatory Visit (HOSPITAL_COMMUNITY): Payer: Self-pay | Admitting: Neurological Surgery

## 2015-11-18 NOTE — Pre-Procedure Instructions (Signed)
Derrick DelayJames [name]  11/18/2015     Your procedure is scheduled on : Thursday November 26, 2015 at 12:40 PM.  [facility] at 10:40 AM.  Call this number if you have problems the morning of surgery: [phone]    Remember:  Do not eat food or drink liquids after midnight.  Take these medicines the morning of surgery with A SIP OF WATER : Clonazepam (Klonopin) if needed, Oxycodone if needed, Tamsulosin (Flomax)   Stop taking any vitamins, herbal medications, NSAIDs, Ibuprofen, Advil, Motrin, Aleve, etc   Do NOT take any diabetic pills the morning of your surgery   How to Manage Your Diabetes Before Surgery   Why is it important to control my blood sugar before and after surgery?   Improving blood sugar levels before and after surgery helps healing and can limit problems.  A way of improving blood sugar control is eating a healthy diet by:  - Eating less sugar and carbohydrates  - Increasing activity/exercise  - Talk with your doctor about reaching your blood sugar goals  High blood sugars (greater than 180 mg/dL) can raise your risk of infections and slow down your recovery so you will need to focus on controlling your diabetes during the weeks before surgery.  Make sure that the doctor who takes care of your diabetes knows about your planned surgery including the date and location.  How do I manage my blood sugars before surgery?   Check your blood sugar at least 4 times a day, 2 days before surgery to make sure that they are not too high or low.   Check your blood sugar the morning of your surgery when you wake up and every 2 hours until you get to the Short-Stay unit.  If your blood sugar is less than 70 mg/dL, you will need to treat for low blood sugar by:  Treat a low blood sugar (less than 70 mg/dL) with 1/2 cup of clear juice (cranberry or apple), 4 glucose tablets, OR glucose gel.  Recheck blood sugar in 15 minutes after treatment (to make  sure it is greater than 70 mg/dL).  If blood sugar is not greater than 70 mg/dL on re-check, call [phone] for further instructions.   Report your blood sugar to the Short-Stay nurse when you get to Short-Stay.  References:  [facility], 2007 "How to Manage your Diabetes Before and After Surgery".  What do I do about my diabetes medications?   Do not take oral diabetes medicines (pills) the morning of surgery.   Do not wear jewelry.  Do not wear lotions, powders, or cologne.    Men may shave face and neck.  Do not bring valuables to the hospital.  [facility] is not responsible for any belongings or valuables.  Contacts, dentures or bridgework may not be worn into surgery.  Leave your suitcase in the car.  After surgery it may be brought to your room.  For patients admitted to the hospital, discharge time will be determined by your treatment team.  Patients discharged the day of surgery will not be allowed to drive home.   Name and phone number of your driver:    Special instructions:  Shower using CHG soap the night before and the morning of your surgery  Please read over the following fact sheets that you were given. Pain Booklet, Coughing and Deep Breathing, Blood Transfusion Information, MRSA Information and Surgical Site Infection Prevention

## 2015-11-19 ENCOUNTER — Encounter (HOSPITAL_COMMUNITY)
Admission: RE | Admit: 2015-11-19 | Discharge: 2015-11-19 | Disposition: A | Discharge status: Home / Self Care | Payer: Worker's Compensation | Source: Ambulatory Visit | Attending: Neurological Surgery | Admitting: Neurological Surgery

## 2015-11-19 ENCOUNTER — Encounter (HOSPITAL_COMMUNITY): Payer: Self-pay

## 2015-11-19 ENCOUNTER — Ambulatory Visit (HOSPITAL_COMMUNITY)
Admission: RE | Admit: 2015-11-19 | Discharge: 2015-11-19 | Disposition: A | Discharge status: Home / Self Care | Payer: Worker's Compensation | Source: Ambulatory Visit | Attending: Neurological Surgery | Admitting: Neurological Surgery

## 2015-11-19 DIAGNOSIS — L409 Psoriasis, unspecified: Secondary | ICD-10-CM | POA: Diagnosis not present

## 2015-11-19 DIAGNOSIS — F329 Major depressive disorder, single episode, unspecified: Secondary | ICD-10-CM | POA: Diagnosis not present

## 2015-11-19 DIAGNOSIS — Z79899 Other long term (current) drug therapy: Secondary | ICD-10-CM | POA: Diagnosis not present

## 2015-11-19 DIAGNOSIS — Z01818 Encounter for other preprocedural examination: Secondary | ICD-10-CM | POA: Insufficient documentation

## 2015-11-19 DIAGNOSIS — Z7984 Long term (current) use of oral hypoglycemic drugs: Secondary | ICD-10-CM | POA: Diagnosis not present

## 2015-11-19 DIAGNOSIS — Z0183 Encounter for blood typing: Secondary | ICD-10-CM | POA: Diagnosis not present

## 2015-11-19 DIAGNOSIS — Z01812 Encounter for preprocedural laboratory examination: Secondary | ICD-10-CM | POA: Diagnosis not present

## 2015-11-19 DIAGNOSIS — M4310 Spondylolisthesis, site unspecified: Secondary | ICD-10-CM | POA: Diagnosis not present

## 2015-11-19 DIAGNOSIS — F419 Anxiety disorder, unspecified: Secondary | ICD-10-CM | POA: Diagnosis not present

## 2015-11-19 DIAGNOSIS — E119 Type 2 diabetes mellitus without complications: Secondary | ICD-10-CM | POA: Insufficient documentation

## 2015-11-19 HISTORY — DX: Major depressive disorder, single episode, unspecified: F32.9

## 2015-11-19 HISTORY — DX: Depression, unspecified: F32.A

## 2015-11-19 LAB — CBC WITH DIFFERENTIAL/PLATELET
Basophils Absolute: 0 10*3/uL (ref 0.0–0.1)
Basophils Relative: 0 %
EOS ABS: 0.2 10*3/uL (ref 0.0–0.7)
EOS PCT: 2 %
HCT: 50.4 % (ref 39.0–52.0)
HEMOGLOBIN: 17.3 g/dL — AB (ref 13.0–17.0)
LYMPHS ABS: 1.3 10*3/uL (ref 0.7–4.0)
LYMPHS PCT: 12 %
MCH: 30.8 pg (ref 26.0–34.0)
MCHC: 34.3 g/dL (ref 30.0–36.0)
MCV: 89.8 fL (ref 78.0–100.0)
MONOS PCT: 3 %
Monocytes Absolute: 0.3 10*3/uL (ref 0.1–1.0)
Neutro Abs: 9.4 10*3/uL — ABNORMAL HIGH (ref 1.7–7.7)
Neutrophils Relative %: 83 %
PLATELETS: 146 10*3/uL — AB (ref 150–400)
RBC: 5.61 MIL/uL (ref 4.22–5.81)
RDW: 13 % (ref 11.5–15.5)
WBC: 11.3 10*3/uL — ABNORMAL HIGH (ref 4.0–10.5)

## 2015-11-19 LAB — BASIC METABOLIC PANEL
Anion gap: 12 (ref 5–15)
BUN: 13 mg/dL (ref 6–20)
CHLORIDE: 99 mmol/L — AB (ref 101–111)
CO2: 25 mmol/L (ref 22–32)
CREATININE: 0.81 mg/dL (ref 0.61–1.24)
Calcium: 9.7 mg/dL (ref 8.9–10.3)
GFR calc Af Amer: >60 mL/min (ref >60)
GFR calc non Af Amer: >60 mL/min (ref >60)
Glucose, Bld: 362 mg/dL — ABNORMAL HIGH (ref 65–99)
POTASSIUM: 5 mmol/L (ref 3.5–5.1)
Sodium: 136 mmol/L (ref 135–145)

## 2015-11-19 LAB — GLUCOSE, CAPILLARY: GLUCOSE-CAPILLARY: 410 mg/dL — AB (ref 65–99)

## 2015-11-19 LAB — SURGICAL PCR SCREEN
MRSA, PCR: NEGATIVE
Staphylococcus aureus: NEGATIVE

## 2015-11-19 LAB — PROTIME-INR
INR: 1.13 (ref 0.00–1.49)
PROTHROMBIN TIME: 14.7 s (ref 11.6–15.2)

## 2015-11-19 LAB — TYPE AND SCREEN
ABO/RH(D): AB POS
ANTIBODY SCREEN: NEGATIVE

## 2015-11-19 LAB — ABO/RH: ABO/RH(D): AB POS

## 2015-11-19 NOTE — Progress Notes (Signed)
PCP is Salli QuarryMatt Zimmermann, PA-C  Patients arrived to PAT via wheelchair. Patient denied having any acute cardiac or pulmonary issues.   CBG on arrival to PAT was 410. Patient stated he drank a large coffee with cream and sweet and low, and drank one energy drink. Patient denied consuming any food today. Patient informed Nurse that he also did not take any diabetic medications last night or this morning. Nurse inquired about patient checking blood glucose at home, and patient stated he does not check his blood sugar at home. Patient stated his last A1c was "seven something."  Patient also informed Nurse that he recently ran out of pain medication because he had been "doubling up" on it, and has been taking Goody Powder for pain. Nurse explained to patient that he would need to stop taking [employer] Powder today.   Nurse called Revonda StandardAllison, PA and informed her of this. Revonda Standardllison stated she would come and see patient.

## 2015-11-19 NOTE — Progress Notes (Addendum)
Anesthesia PAT Evaluation: Patient is a 60 year old male scheduled for L4-5 MAS, PLIF on 11/26/15 by Dr. Yetta BarreJones. Case is workman's comp.  History includes smoking (1ppd), psoriasis, BPH, anxiety, trouble sleeping, depression, DM2, right IHR 02/20/15, L4-5 decompression '14, left IHR '13. BMI 23. PCP is Salli QuarryMatt Zimmermann, PA-C [phone]).   Meds include clonazepam, Flomax, Roxicet, glyburide 5 mg 1 AM 2 PM, metformin 500 mg 1 AM 2 PM. Denied recent steroids.  02/17/15 EKG: NSR.  11/19/15 CXR: IMPRESSION: No acute cardiopulmonary disease.  Preoperative labs noted. CBG on arrival was 410 (363 on BMET done ~ 20 minutes later). Patient had not eaten but had a coffee with cream/sugar ([employer]) and had an energy drink. He reported not taking his DM meds since 11/17/14 AM. He does not check his glucose at home on a regular basis. He though his last A1c was in the 7 range, but when I checked Care Everywhere his last A1c was 10.8 on 07/25/14. A1c was drawn but will not be resulted until tomorrow.    Reviewed with DM RN Coordinator Boneta LucksJenny. Patient denied dry mouth, N/V, visual changes, N/V, fever, confusion. He will need to take him PM dose of glyburide and metformin once he gets home. I discussed S/S with him and his wife that would indicate a need for him to have urgent evaluation in the ED. I also [facility]. She will alert him of labs to see when he would like to see patient. I will make sure his labs and A1c are forwarded for him to review. I asked patient to be compliant with monitoring his fasting glucose between now and surgery. He was told that if his A1c was significantly elevated his surgery could be canceled by his surgeon and if his fasting glucose on the day of surgery was over 200 that his surgery could be canceled by his anesthesiologist. He is desperate to have the surgery due to pain. Will follow-up A1c results tomorrow and contact Matt Zimmermann's office in hopes he  can determine follow-up recommendations. I'll update Dr. Yetta BarreJones' office at that time.  Velna Ochsllison Herlinda Heady, PA-C [facility]Anesthesiology Phone [phone] 11/19/2015 5:55 PM   Addendum: A1c 12.2 consistent with mean plasma glucose of 303. I have left a voice message with Darl PikesSusan at Dr. Yetta BarreJones' office. I have also called results to Le[facility]ita [facility]Morgan [facility] and faxed results with confirmation. I asked that they prioritize getting patient seen for his uncontrolled DM. She will forward as a priority message for Susy FrizzleMatt who is in the office today. Chart will be left for follow-up.   Velna Ochsllison Anaeli Cornwall, PA-C [facility]iology Phone [phone] 11/20/2015 10:04 AM  Addendum: I have reviewed available records in Care Everywhere. PCP office called patient to schedule an appointment, but patient said he would contact them to schedule following his surgery. At his PAT visit, I already reviewed with patient that he is at risk for his surgery being canceled if his fasting glucose on the day of surgery is much over 200, and also that his surgeon may ultimately decide to cancel surgery due to healing complications that can result from uncontrolled DM. I have updated Erie NoeVanessa at Dr. Yetta BarreJones' office. She will have him review. If he plans to proceed as scheduled, patient will get a fasting CBG on arrival and plans to proceed will depend on result and other exam findings. If surgery done, he would likely benefit from an in-patient  DM RN consult and/or Hospitalist consult. Currently, he is not scheduled as a first case.   Velna Ochs [facility] Short Stay Center/Anesthesiology Phone [phone] 11/24/2015 12:13 PM

## 2015-11-20 LAB — HEMOGLOBIN A1C
HEMOGLOBIN A1C: 12.2 % — AB (ref 4.8–5.6)
Mean Plasma Glucose: 303 mg/dL

## 2015-11-26 ENCOUNTER — Inpatient Hospital Stay (HOSPITAL_COMMUNITY): Payer: Worker's Compensation

## 2015-11-26 ENCOUNTER — Inpatient Hospital Stay (HOSPITAL_COMMUNITY): Payer: Worker's Compensation | Admitting: Certified Registered"

## 2015-11-26 ENCOUNTER — Inpatient Hospital Stay (HOSPITAL_COMMUNITY): Payer: Worker's Compensation | Admitting: Vascular Surgery

## 2015-11-26 ENCOUNTER — Encounter (HOSPITAL_COMMUNITY)
Admission: RE | Disposition: A | Discharge status: Home / Self Care | Payer: Self-pay | Source: Ambulatory Visit | Attending: Neurological Surgery

## 2015-11-26 ENCOUNTER — Encounter (HOSPITAL_COMMUNITY): Payer: Self-pay | Admitting: *Deleted

## 2015-11-26 ENCOUNTER — Inpatient Hospital Stay (HOSPITAL_COMMUNITY)
Admission: RE | Admit: 2015-11-26 | Discharge: 2015-11-27 | DRG: 460 | Disposition: A | Discharge status: Home / Self Care | Payer: Worker's Compensation | Source: Ambulatory Visit | Attending: Neurological Surgery | Admitting: Neurological Surgery

## 2015-11-26 DIAGNOSIS — F17210 Nicotine dependence, cigarettes, uncomplicated: Secondary | ICD-10-CM | POA: Diagnosis present

## 2015-11-26 DIAGNOSIS — M961 Postlaminectomy syndrome, not elsewhere classified: Principal | ICD-10-CM | POA: Diagnosis present

## 2015-11-26 DIAGNOSIS — F419 Anxiety disorder, unspecified: Secondary | ICD-10-CM | POA: Diagnosis present

## 2015-11-26 DIAGNOSIS — L409 Psoriasis, unspecified: Secondary | ICD-10-CM | POA: Diagnosis not present

## 2015-11-26 DIAGNOSIS — Z7984 Long term (current) use of oral hypoglycemic drugs: Secondary | ICD-10-CM | POA: Diagnosis not present

## 2015-11-26 DIAGNOSIS — F329 Major depressive disorder, single episode, unspecified: Secondary | ICD-10-CM | POA: Diagnosis present

## 2015-11-26 DIAGNOSIS — Z981 Arthrodesis status: Secondary | ICD-10-CM

## 2015-11-26 DIAGNOSIS — M549 Dorsalgia, unspecified: Secondary | ICD-10-CM | POA: Diagnosis present

## 2015-11-26 DIAGNOSIS — Z419 Encounter for procedure for purposes other than remedying health state, unspecified: Secondary | ICD-10-CM

## 2015-11-26 DIAGNOSIS — E119 Type 2 diabetes mellitus without complications: Secondary | ICD-10-CM | POA: Diagnosis present

## 2015-11-26 HISTORY — PX: MAXIMUM ACCESS (MAS)POSTERIOR LUMBAR INTERBODY FUSION (PLIF) 1 LEVEL: SHX6368

## 2015-11-26 LAB — GLUCOSE, CAPILLARY
GLUCOSE-CAPILLARY: 352 mg/dL — AB (ref 65–99)
Glucose-Capillary: 168 mg/dL — ABNORMAL HIGH (ref 65–99)
Glucose-Capillary: 262 mg/dL — ABNORMAL HIGH (ref 65–99)

## 2015-11-26 SURGERY — FOR MAXIMUM ACCESS (MAS) POSTERIOR LUMBAR INTERBODY FUSION (PLIF) 1 LEVEL
Anesthesia: General | Site: Back

## 2015-11-26 MED ORDER — PHENYLEPHRINE HCL 10 MG/ML IJ SOLN
10.0000 mg | INTRAVENOUS | Status: DC | PRN
  Administered 2015-11-26: 30 ug/min via INTRAVENOUS

## 2015-11-26 MED ORDER — GLYBURIDE-METFORMIN 5-500 MG PO TABS: 1.0000 | ORAL_TABLET | Freq: Two times a day (BID) | ORAL | Status: DC

## 2015-11-26 MED ORDER — MIDAZOLAM HCL 2 MG/2ML IJ SOLN
INTRAMUSCULAR | Status: AC
  Filled 2015-11-26: qty 2

## 2015-11-26 MED ORDER — OXYCODONE-ACETAMINOPHEN 5-325 MG PO TABS
ORAL_TABLET | ORAL | Status: AC
  Administered 2015-11-26: 2 via ORAL
  Filled 2015-11-26: qty 2

## 2015-11-26 MED ORDER — CEFAZOLIN SODIUM-DEXTROSE 2-3 GM-% IV SOLR
2.0000 g | INTRAVENOUS | Status: AC
  Administered 2015-11-26: 2 g via INTRAVENOUS

## 2015-11-26 MED ORDER — FENTANYL CITRATE (PF) 100 MCG/2ML IJ SOLN
INTRAMUSCULAR | Status: AC
  Administered 2015-11-26: 50 ug via INTRAVENOUS
  Filled 2015-11-26: qty 2

## 2015-11-26 MED ORDER — PHENYLEPHRINE HCL 10 MG/ML IJ SOLN
INTRAMUSCULAR | Status: DC | PRN
  Administered 2015-11-26 (×2): 200 ug via INTRAVENOUS

## 2015-11-26 MED ORDER — MORPHINE SULFATE (PF) 2 MG/ML IV SOLN
1.0000 mg | INTRAVENOUS | Status: DC | PRN
  Administered 2015-11-26 (×2): 2 mg via INTRAVENOUS
  Administered 2015-11-27: 4 mg via INTRAVENOUS
  Administered 2015-11-27: 2 mg via INTRAVENOUS
  Filled 2015-11-26: qty 1
  Filled 2015-11-26: qty 2
  Filled 2015-11-26 (×2): qty 1

## 2015-11-26 MED ORDER — PHENOL 1.4 % MT LIQD: 1.0000 | OROMUCOSAL | Status: DC | PRN

## 2015-11-26 MED ORDER — INSULIN ASPART 100 UNIT/ML ~~LOC~~ SOLN
0.0000 [IU] | Freq: Three times a day (TID) | SUBCUTANEOUS | Status: DC
  Administered 2015-11-27: 3 [IU] via SUBCUTANEOUS

## 2015-11-26 MED ORDER — VANCOMYCIN HCL 1000 MG IV SOLR
INTRAVENOUS | Status: AC
  Filled 2015-11-26: qty 1000

## 2015-11-26 MED ORDER — BACITRACIN 50000 UNITS IM SOLR
INTRAMUSCULAR | Status: DC | PRN
  Administered 2015-11-26: 13:00:00

## 2015-11-26 MED ORDER — ACETAMINOPHEN 325 MG PO TABS
650.0000 mg | ORAL_TABLET | ORAL | Status: DC | PRN
  Administered 2015-11-27: 650 mg via ORAL
  Filled 2015-11-26: qty 2

## 2015-11-26 MED ORDER — FENTANYL CITRATE (PF) 100 MCG/2ML IJ SOLN
INTRAMUSCULAR | Status: DC | PRN
  Administered 2015-11-26: 50 ug via INTRAVENOUS
  Administered 2015-11-26: 25 ug via INTRAVENOUS
  Administered 2015-11-26: 50 ug via INTRAVENOUS
  Administered 2015-11-26: 100 ug via INTRAVENOUS
  Administered 2015-11-26: 50 ug via INTRAVENOUS
  Administered 2015-11-26: 100 ug via INTRAVENOUS
  Administered 2015-11-26 (×2): 50 ug via INTRAVENOUS
  Administered 2015-11-26: 25 ug via INTRAVENOUS

## 2015-11-26 MED ORDER — LIDOCAINE HCL (CARDIAC) 20 MG/ML IV SOLN
INTRAVENOUS | Status: DC | PRN
  Administered 2015-11-26: 40 mg via INTRAVENOUS

## 2015-11-26 MED ORDER — FENTANYL CITRATE (PF) 100 MCG/2ML IJ SOLN
25.0000 ug | INTRAMUSCULAR | Status: DC | PRN
  Administered 2015-11-26 (×3): 50 ug via INTRAVENOUS

## 2015-11-26 MED ORDER — CELECOXIB 200 MG PO CAPS
200.0000 mg | ORAL_CAPSULE | Freq: Two times a day (BID) | ORAL | Status: DC
  Administered 2015-11-26 – 2015-11-27 (×2): 200 mg via ORAL
  Filled 2015-11-26 (×2): qty 1

## 2015-11-26 MED ORDER — LACTATED RINGERS IV SOLN
INTRAVENOUS | Status: DC
  Administered 2015-11-26: 12:00:00 via INTRAVENOUS

## 2015-11-26 MED ORDER — THROMBIN 20000 UNITS EX SOLR
CUTANEOUS | Status: DC | PRN
  Administered 2015-11-26: 13:00:00 via TOPICAL

## 2015-11-26 MED ORDER — ONDANSETRON HCL 4 MG/2ML IJ SOLN: 4.0000 mg | INTRAMUSCULAR | Status: DC | PRN

## 2015-11-26 MED ORDER — VANCOMYCIN HCL 1000 MG IV SOLR
INTRAVENOUS | Status: DC | PRN
  Administered 2015-11-26: 1000 mg via TOPICAL

## 2015-11-26 MED ORDER — DEXMEDETOMIDINE HCL IN NACL 200 MCG/50ML IV SOLN
INTRAVENOUS | Status: AC
  Filled 2015-11-26: qty 50

## 2015-11-26 MED ORDER — DEXAMETHASONE SODIUM PHOSPHATE 4 MG/ML IJ SOLN
INTRAMUSCULAR | Status: DC | PRN
  Administered 2015-11-26: 4 mg via INTRAVENOUS

## 2015-11-26 MED ORDER — METHOCARBAMOL 500 MG PO TABS
500.0000 mg | ORAL_TABLET | Freq: Four times a day (QID) | ORAL | Status: DC | PRN
  Administered 2015-11-26 – 2015-11-27 (×3): 500 mg via ORAL
  Filled 2015-11-26 (×2): qty 1

## 2015-11-26 MED ORDER — GLYBURIDE 5 MG PO TABS
5.0000 mg | ORAL_TABLET | Freq: Every morning | ORAL | Status: DC
  Administered 2015-11-27: 5 mg via ORAL
  Filled 2015-11-26: qty 1

## 2015-11-26 MED ORDER — TAMSULOSIN HCL 0.4 MG PO CAPS
0.4000 mg | ORAL_CAPSULE | Freq: Every day | ORAL | Status: DC
  Administered 2015-11-27: 0.4 mg via ORAL
  Filled 2015-11-26: qty 1

## 2015-11-26 MED ORDER — MENTHOL 3 MG MT LOZG: 1.0000 | LOZENGE | OROMUCOSAL | Status: DC | PRN

## 2015-11-26 MED ORDER — METHOCARBAMOL 500 MG PO TABS
ORAL_TABLET | ORAL | Status: AC
Start: 2015-11-26 — End: 2015-11-26
  Administered 2015-11-26: 500 mg via ORAL
  Filled 2015-11-26: qty 1

## 2015-11-26 MED ORDER — LACTATED RINGERS IV SOLN
INTRAVENOUS | Status: DC | PRN
  Administered 2015-11-26 (×2): via INTRAVENOUS

## 2015-11-26 MED ORDER — INSULIN ASPART 100 UNIT/ML ~~LOC~~ SOLN
0.0000 [IU] | Freq: Every day | SUBCUTANEOUS | Status: DC
  Administered 2015-11-26: 5 [IU] via SUBCUTANEOUS

## 2015-11-26 MED ORDER — ACETAMINOPHEN 650 MG RE SUPP: 650.0000 mg | RECTAL | Status: DC | PRN

## 2015-11-26 MED ORDER — FENTANYL CITRATE (PF) 250 MCG/5ML IJ SOLN
INTRAMUSCULAR | Status: AC
  Filled 2015-11-26: qty 5

## 2015-11-26 MED ORDER — SODIUM CHLORIDE 0.9 % IV SOLN: 250.0000 mL | INTRAVENOUS | Status: DC

## 2015-11-26 MED ORDER — POTASSIUM CHLORIDE IN NACL 20-0.9 MEQ/L-% IV SOLN
INTRAVENOUS | Status: DC
  Administered 2015-11-26: 20:00:00 via INTRAVENOUS
  Filled 2015-11-26: qty 1000

## 2015-11-26 MED ORDER — MIDAZOLAM HCL 5 MG/5ML IJ SOLN
INTRAMUSCULAR | Status: DC | PRN
  Administered 2015-11-26: 2 mg via INTRAVENOUS

## 2015-11-26 MED ORDER — SENNA 8.6 MG PO TABS
1.0000 | ORAL_TABLET | Freq: Two times a day (BID) | ORAL | Status: DC
  Administered 2015-11-26 – 2015-11-27 (×2): 8.6 mg via ORAL
  Filled 2015-11-26 (×2): qty 1

## 2015-11-26 MED ORDER — CEFAZOLIN SODIUM 1-5 GM-% IV SOLN
1.0000 g | Freq: Three times a day (TID) | INTRAVENOUS | Status: AC
  Administered 2015-11-26 – 2015-11-27 (×2): 1 g via INTRAVENOUS
  Filled 2015-11-26 (×3): qty 50

## 2015-11-26 MED ORDER — OXYCODONE-ACETAMINOPHEN 5-325 MG PO TABS
1.0000 | ORAL_TABLET | ORAL | Status: DC | PRN
  Administered 2015-11-26 – 2015-11-27 (×5): 2 via ORAL
  Filled 2015-11-26 (×4): qty 2

## 2015-11-26 MED ORDER — SUCCINYLCHOLINE CHLORIDE 20 MG/ML IJ SOLN
INTRAMUSCULAR | Status: DC | PRN
  Administered 2015-11-26: 120 mg via INTRAVENOUS

## 2015-11-26 MED ORDER — SODIUM CHLORIDE 0.9 % IJ SOLN: 3.0000 mL | INTRAMUSCULAR | Status: DC | PRN

## 2015-11-26 MED ORDER — METFORMIN HCL 500 MG PO TABS: 1000.0000 mg | ORAL_TABLET | Freq: Every day | ORAL | Status: DC

## 2015-11-26 MED ORDER — METHOCARBAMOL 1000 MG/10ML IJ SOLN
500.0000 mg | Freq: Four times a day (QID) | INTRAVENOUS | Status: DC | PRN
  Filled 2015-11-26: qty 5

## 2015-11-26 MED ORDER — 0.9 % SODIUM CHLORIDE (POUR BTL) OPTIME
TOPICAL | Status: DC | PRN
  Administered 2015-11-26: 1000 mL

## 2015-11-26 MED ORDER — PROPOFOL 500 MG/50ML IV EMUL
INTRAVENOUS | Status: DC | PRN
  Administered 2015-11-26: 50 ug/kg/min via INTRAVENOUS

## 2015-11-26 MED ORDER — SODIUM CHLORIDE 0.9 % IJ SOLN
3.0000 mL | Freq: Two times a day (BID) | INTRAMUSCULAR | Status: DC
  Administered 2015-11-26: 3 mL via INTRAVENOUS

## 2015-11-26 MED ORDER — DEXAMETHASONE SODIUM PHOSPHATE 10 MG/ML IJ SOLN
10.0000 mg | INTRAMUSCULAR | Status: DC
  Filled 2015-11-26: qty 1

## 2015-11-26 MED ORDER — METFORMIN HCL 500 MG PO TABS
500.0000 mg | ORAL_TABLET | Freq: Every day | ORAL | Status: DC
Start: 2015-11-27 — End: 2015-11-27
  Administered 2015-11-27: 500 mg via ORAL
  Filled 2015-11-26: qty 1

## 2015-11-26 MED ORDER — ONDANSETRON HCL 4 MG/2ML IJ SOLN
INTRAMUSCULAR | Status: DC | PRN
  Administered 2015-11-26: 4 mg via INTRAVENOUS

## 2015-11-26 MED ORDER — GLYBURIDE 5 MG PO TABS
10.0000 mg | ORAL_TABLET | Freq: Every day | ORAL | Status: DC
Start: 2015-11-27 — End: 2015-11-27
  Filled 2015-11-26: qty 2

## 2015-11-26 MED ORDER — DEXMEDETOMIDINE HCL 200 MCG/2ML IV SOLN
INTRAVENOUS | Status: DC | PRN
  Administered 2015-11-26: 8 ug via INTRAVENOUS
  Administered 2015-11-26 (×2): 4 ug via INTRAVENOUS
  Administered 2015-11-26: 8 ug via INTRAVENOUS

## 2015-11-26 MED ORDER — BUPIVACAINE HCL (PF) 0.25 % IJ SOLN
INTRAMUSCULAR | Status: DC | PRN
  Administered 2015-11-26: 3 mL

## 2015-11-26 MED ORDER — PROPOFOL 10 MG/ML IV BOLUS
INTRAVENOUS | Status: DC | PRN
  Administered 2015-11-26: 10 mg via INTRAVENOUS
  Administered 2015-11-26: 150 mg via INTRAVENOUS
  Administered 2015-11-26: 20 mg via INTRAVENOUS
  Administered 2015-11-26: 10 mg via INTRAVENOUS

## 2015-11-26 MED ORDER — PROMETHAZINE HCL 25 MG/ML IJ SOLN: 6.2500 mg | INTRAMUSCULAR | Status: DC | PRN

## 2015-11-26 MED ORDER — THROMBIN 5000 UNITS EX SOLR
CUTANEOUS | Status: DC | PRN
  Administered 2015-11-26: 13:00:00 via TOPICAL

## 2015-11-26 SURGICAL SUPPLY — 66 items
BAG DECANTER FOR FLEXI CONT (MISCELLANEOUS) ×3 IMPLANT
BENZOIN TINCTURE PRP APPL 2/3 (GAUZE/BANDAGES/DRESSINGS) ×3 IMPLANT
BIT DRILL PLIF MAS 5.0MM DISP (DRILL) ×1 IMPLANT
BLADE CLIPPER SURG (BLADE) ×3 IMPLANT
BONE MATRIX OSTEOCEL PRO MED (Bone Implant) ×3 IMPLANT
BUR MATCHSTICK NEURO 3.0 LAGG (BURR) ×3 IMPLANT
CANISTER SUCT 3000ML PPV (MISCELLANEOUS) ×3 IMPLANT
CLIP NEUROVISION LG (CLIP) ×3 IMPLANT
CLOSURE WOUND 1/2 X4 (GAUZE/BANDAGES/DRESSINGS) ×1
CONT SPEC 4OZ CLIKSEAL STRL BL (MISCELLANEOUS) ×3 IMPLANT
COVER BACK TABLE 24X17X13 BIG (DRAPES) IMPLANT
COVER BACK TABLE 60X90IN (DRAPES) ×3 IMPLANT
DERMABOND ADHESIVE PROPEN (GAUZE/BANDAGES/DRESSINGS) ×2
DERMABOND ADVANCED .7 DNX6 (GAUZE/BANDAGES/DRESSINGS) ×1 IMPLANT
DRAPE C-ARM 42X72 X-RAY (DRAPES) ×3 IMPLANT
DRAPE C-ARMOR (DRAPES) ×3 IMPLANT
DRAPE LAPAROTOMY 100X72X124 (DRAPES) ×3 IMPLANT
DRAPE POUCH INSTRU U-SHP 10X18 (DRAPES) ×3 IMPLANT
DRAPE SURG 17X23 STRL (DRAPES) ×3 IMPLANT
DRILL PLIF MAS 5.0MM DISP (DRILL) ×3
DRSG OPSITE POSTOP 4X6 (GAUZE/BANDAGES/DRESSINGS) ×3 IMPLANT
DURAPREP 26ML APPLICATOR (WOUND CARE) ×3 IMPLANT
ELECT REM PT RETURN 9FT ADLT (ELECTROSURGICAL) ×3
ELECTRODE REM PT RTRN 9FT ADLT (ELECTROSURGICAL) ×1 IMPLANT
EVACUATOR 1/8 PVC DRAIN (DRAIN) IMPLANT
GAUZE SPONGE 4X4 16PLY XRAY LF (GAUZE/BANDAGES/DRESSINGS) IMPLANT
GLOVE BIO SURGEON STRL SZ8 (GLOVE) ×6 IMPLANT
GLOVE BIOGEL PI IND STRL 7.0 (GLOVE) ×4 IMPLANT
GLOVE BIOGEL PI IND STRL 7.5 (GLOVE) ×2 IMPLANT
GLOVE BIOGEL PI INDICATOR 7.0 (GLOVE) ×8
GLOVE BIOGEL PI INDICATOR 7.5 (GLOVE) ×4
GLOVE ECLIPSE 6.5 STRL STRAW (GLOVE) ×3 IMPLANT
GOWN STRL REUS W/ TWL LRG LVL3 (GOWN DISPOSABLE) ×3 IMPLANT
GOWN STRL REUS W/ TWL XL LVL3 (GOWN DISPOSABLE) ×1 IMPLANT
GOWN STRL REUS W/TWL 2XL LVL3 (GOWN DISPOSABLE) IMPLANT
GOWN STRL REUS W/TWL LRG LVL3 (GOWN DISPOSABLE) ×6
GOWN STRL REUS W/TWL XL LVL3 (GOWN DISPOSABLE) ×2
HEMOSTAT POWDER KIT SURGIFOAM (HEMOSTASIS) ×3 IMPLANT
KIT BASIN OR (CUSTOM PROCEDURE TRAY) ×3 IMPLANT
KIT ROOM TURNOVER OR (KITS) ×3 IMPLANT
MILL MEDIUM DISP (BLADE) ×3 IMPLANT
MODULE NVM5 NEXT GEN EMG (NEEDLE) ×3 IMPLANT
NEEDLE HYPO 25X1 1.5 SAFETY (NEEDLE) ×3 IMPLANT
NS IRRIG 1000ML POUR BTL (IV SOLUTION) ×3 IMPLANT
PACK LAMINECTOMY NEURO (CUSTOM PROCEDURE TRAY) ×3 IMPLANT
PAD ARMBOARD 7.5X6 YLW CONV (MISCELLANEOUS) ×9 IMPLANT
PEEK COROENT TLIF 12X9X30-8 (Peek) ×3 IMPLANT
ROD 35MM (Rod) ×6 IMPLANT
SCREW LOCK (Screw) ×8 IMPLANT
SCREW LOCK FXNS SPNE MAS PL (Screw) ×4 IMPLANT
SCREW PLIF MAS 5.0X35 (Screw) ×6 IMPLANT
SCREW SHANK 5.0X35 (Screw) ×6 IMPLANT
SCREW TULIP 5.5 (Screw) ×6 IMPLANT
SPONGE LAP 4X18 X RAY DECT (DISPOSABLE) IMPLANT
SPONGE SURGIFOAM ABS GEL 100 (HEMOSTASIS) ×3 IMPLANT
STRIP CLOSURE SKIN 1/2X4 (GAUZE/BANDAGES/DRESSINGS) ×2 IMPLANT
SUT VIC AB 0 CT1 18XCR BRD8 (SUTURE) ×1 IMPLANT
SUT VIC AB 0 CT1 8-18 (SUTURE) ×2
SUT VIC AB 2-0 CP2 18 (SUTURE) ×3 IMPLANT
SUT VIC AB 3-0 SH 8-18 (SUTURE) ×6 IMPLANT
SYR 3ML LL SCALE MARK (SYRINGE) IMPLANT
TOWEL OR 17X24 6PK STRL BLUE (TOWEL DISPOSABLE) IMPLANT
TOWEL OR 17X26 10 PK STRL BLUE (TOWEL DISPOSABLE) ×3 IMPLANT
TRAP SPECIMEN MUCOUS 40CC (MISCELLANEOUS) ×3 IMPLANT
TRAY FOLEY W/METER SILVER 14FR (SET/KITS/TRAYS/PACK) ×3 IMPLANT
WATER STERILE IRR 1000ML POUR (IV SOLUTION) ×3 IMPLANT

## 2015-11-26 NOTE — Transfer of Care (Signed)
Immediate Anesthesia Transfer of Care Note  Patient: Derrick [name]  Procedure(s) Performed: Procedure(s): Lumbar four-five MAXIMUM ACCESS TRANSFORAMINAL LUMBAR INTERBODY FUSION  (N/A)  Patient Location: PACU  Anesthesia Type:General  Level of Consciousness: awake, alert , pateint uncooperative and confused  Airway & Oxygen Therapy: Patient Spontanous Breathing  Post-op Assessment: Report given to RN and Post -op Vital signs reviewed and stable  Post vital signs: Reviewed and stable  Last Vitals:  Filed Vitals:   11/26/15 1115  BP: 121/78  Pulse: 98  Temp: 36.8 C  Resp: 20    Complications: No apparent anesthesia complications    Additional sedation given. Patient now resting comfortably.

## 2015-11-26 NOTE — Op Note (Signed)
11/26/2015  3:19 PM  PATIENT:  Derrick [name]  60 y.o. 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 Re-do Decompressive lumbar laminectomy L4-5 requiring more work than would be required for a simple exposure of the disk for PLIF in order to adequately decompress the neural elements and address the spinal stenosis 2. transforaminal lumbar interbody fusion L4-5 using PEEK interbody cage packed with morcellized allograft and autograft 3. Posterior fixation L4-5 using nuvasive cortical pedicle screws.    SURGEON:  Marikay Alar, MD  ASSISTANTS: Dr. Janice Norrie  ANESTHESIA:  General  EBL: 275 ml  Total I/O In: 1750 [I.V.:1750] Out: 1075 [Urine:800; Blood:275]  BLOOD ADMINISTERED:none  DRAINS: Hemovac   INDICATION FOR PROCEDURE: The patient underwent a previous bilateral laminectomy by orthopedic surgeon. He developed back and bilateral leg pain. MRI showed a postlaminectomy spondylolisthesis at L4-5 with disc protrusion. He tried medical management without relief. His pain was severe. I recommended a decompression instrument fusion to address his segmental instability and spinal stenosis. Patient understood the risks, benefits, and alternatives and potential outcomes and wished to proceed.  PROCEDURE DETAILS:  The patient was brought to the operating room. After induction of generalized endotracheal anesthesia the patient was rolled into the prone position on chest rolls and all pressure points were padded. The patient's lumbar region was cleaned and then prepped with DuraPrep and draped in the usual sterile fashion. Anesthesia was injected and then a dorsal midline incision was made and carried down to the lumbosacral fascia. The fascia was opened and the paraspinous musculature was taken down in a subperiosteal fashion to expose L4-5. A self-retaining retractor was placed. Intraoperative  fluoroscopy confirmed my level, and I started with placement of the L4 cortical pedicle screws. The pedicle screw entry zones were identified utilizing surface landmarks and  AP and lateral fluoroscopy. I scored the cortex with the high-speed drill and then used the hand drill and EMG monitoring to drill an upward and outward direction into the pedicle. I then tapped line to line, and the tap was also monitored. I then placed a 5-0 x 35 mm cortical pedicle screw into the pedicles of L4 bilaterally. I then turned my attention to the decompression and the spinous process was removed and complete lumbar laminectomies, hemi- facetectomies, and foraminotomies were performed at L4-5. It was significant scarring secondary to his previous surgery. Great care was taken to dissect the bony edges away from the epidural fibrosis. At no time did we see CSF. The patient had significant spinal stenosis and this required more work than would be required for a simple exposure of the disc for posterior lumbar interbody fusion. Much more generous decompression was undertaken in order to adequately decompress the neural elements and address the patient's leg pain. The yellow ligament was removed to expose the underlying dura and nerve roots, and generous foraminotomies were performed to adequately decompress the neural elements. Both the exiting and traversing nerve roots were decompressed on both sides until a coronary dilator passed easily along the nerve roots. Once the decompression was complete, I turned my attention to the posterior lower lumbar interbody fusion. The epidural venous vasculature was coagulated and cut sharply. Disc space was incised and the initial discectomy was performed with pituitary rongeurs. The disc space was distracted with sequential distractors to a height of 10 mm. We then used a series of scrapers and shavers to prepare the endplates for fusion. The  midline was prepared with Epstein curettes. Once the  complete discectomy was finished, we packed an appropriate sized peek interbody cage with local autograft and morcellized allograft, gently retracted the nerve root, and tapped the cage into position at L4-5 from the left performing a transforaminal lumbar interbody fusion.  The area behind the cage was packed with morselized autograft and allograft. We then turned our attention to the placement of the lower pedicle screws. The pedicle screw entry zones were identified utilizing surface landmarks and fluoroscopy. I drilled into each pedicle utilizing the hand drill and EMG monitoring, and tapped each pedicle with the appropriate tap. We palpated with a ball probe to assure no break in the cortex. We then placed 5-0 x 35 mm cortical pedicle screws into the pedicles bilaterally at L5.  We then placed lordotic rods into the multiaxial screw heads of the pedicle screws and locked these in position with the locking caps and anti-torque device. We then checked our construct with AP and lateral fluoroscopy. Irrigated with copious amounts of bacitracin-containing saline solution. Placed a medium Hemovac drain through separate stab incision. Inspected the nerve roots once again to assure adequate decompression, lined to the dura with Gelfoam, and closed the muscle and the fascia with 0 Vicryl. Closed the subcutaneous tissues with 2-0 Vicryl and subcuticular tissues with 3-0 Vicryl. The skin was closed with benzoin and Steri-Strips. Dressing was then applied, the patient was awakened from general anesthesia and transported to the recovery room in stable condition. At the end of the procedure all sponge, needle and instrument counts were correct.   PLAN OF CARE: Admit to inpatient   PATIENT DISPOSITION:  PACU - hemodynamically stable.   [name] start of Pharmacological VTE agent (>24hrs) due to surgical blood loss or risk of bleeding:  yes

## 2015-11-26 NOTE — Anesthesia Procedure Notes (Signed)
Procedure Name: Intubation Date/Time: 11/26/2015 1:02 PM Performed by: Daiva EvesAVENEL, Nikkie Liming W Pre-anesthesia Checklist: Patient identified, Timeout performed, Emergency Drugs available, Suction available and Patient being monitored Patient Re-evaluated:Patient Re-evaluated prior to inductionOxygen Delivery Method: Circle system utilized Preoxygenation: Pre-oxygenation with 100% oxygen Intubation Type: IV induction Laryngoscope Size: Mac and 4 Grade View: Grade II Tube type: Oral Tube size: 7.5 mm Number of attempts: 1 Airway Equipment and Method: Stylet Placement Confirmation: ETT inserted through vocal cords under direct vision,  breath sounds checked- equal and bilateral,  CO2 detector and positive ETCO2 Secured at: 22 cm Tube secured with: Tape Dental Injury: Teeth and Oropharynx as per pre-operative assessment  Comments: Grade 2/3 view. Arytenoids only.

## 2015-11-26 NOTE — H&P (Signed)
Subjective: Patient is a 60 y.o. male admitted for postlaminectomy spondylolisthesis L4-5. Status post L4-5 laminectomy by another surgeon in the past. Onset of symptoms was a few months ago, gradually worsening since that time.  The pain is rated intense, unremitting, and is located at the across the lower back and radiates to legs. The pain is described as aching and occurs all day. The symptoms have been progressive. Symptoms are exacerbated by exercise. MRI or CT showed postlaminectomy spondylolisthesis L4-5   Past Medical History  Diagnosis Date  . Spinal stenosis, lumbar   . Psoriasis   . Inguinal hernia     RT  . BPH (benign prostatic hyperplasia)   . Difficulty sleeping   . Anxiety   . Depression   . Diabetes mellitus     Type 2     Past Surgical History  Procedure Laterality Date  . Rotator cuff repair      right x2  . Knee arthroscopy      right knee  . Inguinal hernia repair  05/29/2012    Procedure: LAPAROSCOPIC INGUINAL HERNIA;  Surgeon: Atilano InaEric M Wilson, MD,FACS;  Location: [facility];  Service: General;  Laterality: Left;  . Hernia repair  05/29/12    [facility]  . Lumbar laminectomy/decompression microdiscectomy Right 03/14/2013    Procedure: MICRO LUMBAR DECOMPRESSION L4-5 RIGHT POSSIBLE LEFT;  Surgeon: Javier DockerJeffrey C Beane, MD;  Location: [facility];  Service: Orthopedics;  Laterality: Right;  . Inguinal hernia repair Right 02/20/2015    Procedure: LAPAROSCOPIC INGUINAL HERNIA REPAIR WITH MESH;  Surgeon: Gaynelle AduEric Wilson, MD;  Location: [facility];  Service: General;  Laterality: Right;  . Insertion of mesh Right 02/20/2015    Procedure: INSERTION OF MESH;  Surgeon: Gaynelle AduEric Wilson, MD;  Location: [facility];  Service: General;  Laterality: Right;  . Colonoscopy      Prior to Admission medications   Medication Sig Start Date End Date Taking? Authorizing Provider  clonazePAM (KLONOPIN) 1 MG tablet Take 1 mg by mouth 3 (three) times daily as needed for anxiety.   Yes Historical Provider, MD  glyBURIDE  (DIABETA) 5 MG tablet Take 5-10 mg by mouth 2 (two) times daily with a meal. Takes 1 in the morning and 2 in the evening.   Yes Historical Provider, MD  metFORMIN (GLUCOPHAGE) 500 MG tablet Take 500-1,000 mg by mouth 2 (two) times daily with a meal.   Yes Historical Provider, MD  oxyCODONE-acetaminophen (ROXICET) 5-325 MG per tablet Take 1-2 tablets by mouth every 4 (four) hours as needed for severe pain. 02/20/15  Yes Gaynelle AduEric Wilson, MD  tamsulosin (FLOMAX) 0.4 MG CAPS capsule Take 1 capsule (0.4 mg total) by mouth daily. 02/20/15  Yes Gaynelle AduEric Wilson, MD  glyBURIDE-metformin (GLUCOVANCE) 5-500 MG per tablet Take 1-2 tablets by mouth 2 (two) times daily. 1 in am and 2 in the evening at supper    Historical Provider, MD   Allergies  Allergen Reactions  . Naproxen Hives    Also includes OTC ALEVE  . Nsaids Hives    Social History  Substance Use Topics  . Smoking status: Current Every Day Smoker -- 1.00 packs/day for 40 years    Types: Cigarettes  . Smokeless tobacco: Not on file  . Alcohol Use: No    Family History  Problem Relation Age of Onset  . Cancer Mother     breast and thyroid  . Cancer Father     prostate     Review of Systems  Positive ROS: Negative  All other systems have been reviewed and were otherwise negative with the exception of those mentioned in the HPI and as above.  Objective: Vital signs in last 24 hours: Temp:  [98.2 F (36.8 C)] 98.2 F (36.8 C) (01/12 1115) Pulse Rate:  [98] 98 (01/12 1115) Resp:  [20] 20 (01/12 1115) BP: (121)/(78) 121/78 mmHg (01/12 1115) SpO2:  [98 %] 98 % (01/12 1115) Weight:  [82.101 kg (181 lb)] 82.101 kg (181 lb) (01/12 1115)  General Appearance: Alert, cooperative, no distress, appears stated age Head: Normocephalic, without obvious abnormality, atraumatic Eyes: PERRL, conjunctiva/corneas clear, EOM's intact    Neck: Supple, symmetrical, trachea midline Back: Symmetric, no curvature, ROM normal, no CVA tenderness Lungs:   respirations unlabored Heart: Regular rate and rhythm Abdomen: Soft, non-tender Extremities: Extremities normal, atraumatic, no cyanosis or edema Pulses: 2+ and symmetric all extremities Skin: Skin color, texture, turgor normal, no rashes or lesions  NEUROLOGIC:   Mental status: Alert and oriented x4,  no aphasia, good attention span, fund of knowledge, and memory Motor Exam - grossly normal Sensory Exam - grossly normal Reflexes: 1+ Coordination - grossly normal Gait - grossly normal Balance - grossly normal Cranial Nerves: I: smell Not tested  II: visual acuity  OS: nl    OD: nl  II: visual fields Full to confrontation  II: pupils Equal, round, reactive to light  III,VII: ptosis None  III,IV,VI: extraocular muscles  Full ROM  V: mastication Normal  V: facial light touch sensation  Normal  V,VII: corneal reflex  Present  VII: facial muscle function - upper  Normal  VII: facial muscle function - lower Normal  VIII: hearing Not tested  IX: soft palate elevation  Normal  IX,X: gag reflex Present  XI: trapezius strength  5/5  XI: sternocleidomastoid strength 5/5  XI: neck flexion strength  5/5  XII: tongue strength  Normal    Data Review Lab Results  Component Value Date   WBC 11.3* 11/19/2015   HGB 17.3* 11/19/2015   HCT 50.4 11/19/2015   MCV 89.8 11/19/2015   PLT 146* 11/19/2015   Lab Results  Component Value Date   NA 136 11/19/2015   K 5.0 11/19/2015   CL 99* 11/19/2015   CO2 25 11/19/2015   BUN 13 11/19/2015   CREATININE 0.81 11/19/2015   GLUCOSE 362* 11/19/2015   Lab Results  Component Value Date   INR 1.13 11/19/2015    Assessment/Plan: Patient admitted for PLIF L4-5. Patient has failed a reasonable attempt at conservative therapy.  I explained the condition and procedure to the patient and answered any questions.  Patient wishes to proceed with procedure as planned. Understands risks/ benefits and typical outcomes of procedure.   Rosan Calbert  S 11/26/2015 11:41 AM

## 2015-11-26 NOTE — Anesthesia Preprocedure Evaluation (Addendum)
Anesthesia Evaluation  Patient identified by MRN, date of birth, ID band Patient awake    Reviewed: Allergy & Precautions, NPO status , Patient's Chart, lab work & pertinent test results  History of Anesthesia Complications Negative for: history of anesthetic complications  Airway Mallampati: II  TM Distance: <3 FB Neck ROM: Full    Dental  (+) Dental Advisory Given, Teeth Intact   Pulmonary Current Smoker (1 PPD),    Pulmonary exam normal breath sounds clear to auscultation       Cardiovascular Exercise Tolerance: Good (-) hypertension(-) angina(-) CAD and (-) Past MI negative cardio ROS Normal cardiovascular exam Rhythm:Regular Rate:Normal     Neuro/Psych PSYCHIATRIC DISORDERS Anxiety Depression negative neurological ROS     GI/Hepatic negative GI ROS, Neg liver ROS,   Endo/Other  negative endocrine ROSdiabetes, Poorly Controlled, Type 2, Oral Hypoglycemic Agents  Renal/GU negative Renal ROS     Musculoskeletal negative musculoskeletal ROS (+)   Abdominal   Peds  Hematology negative hematology ROS (+)   Anesthesia Other Findings Day of surgery medications reviewed with the patient.  Reproductive/Obstetrics                            Anesthesia Physical Anesthesia Plan  ASA: III  Anesthesia Plan: General   Post-op Pain Management:    Induction: Intravenous  Airway Management Planned: Oral ETT and Video Laryngoscope Planned  Additional Equipment:   Intra-op Plan:   Post-operative Plan: Extubation in OR  Informed Consent: I have reviewed the patients History and Physical, chart, labs and discussed the procedure including the risks, benefits and alternatives for the proposed anesthesia with the patient or authorized representative who has indicated his/her understanding and acceptance.   Dental advisory given  Plan Discussed with: CRNA  Anesthesia Plan Comments:  (Risks/benefits of general anesthesia discussed with patient including risk of damage to teeth, lips, gum, and tongue, nausea/vomiting, allergic reactions to medications, and the possibility of heart attack, stroke and death.  All patient questions answered.  Patient wishes to proceed.)        Anesthesia Quick Evaluation

## 2015-11-27 ENCOUNTER — Encounter (HOSPITAL_COMMUNITY): Payer: Self-pay | Admitting: Neurological Surgery

## 2015-11-27 LAB — GLUCOSE, CAPILLARY
GLUCOSE-CAPILLARY: 244 mg/dL — AB (ref 65–99)
Glucose-Capillary: 195 mg/dL — ABNORMAL HIGH (ref 65–99)

## 2015-11-27 MED ORDER — METHOCARBAMOL 500 MG PO TABS: 500.0000 mg | ORAL_TABLET | Freq: Four times a day (QID) | ORAL | Status: AC | PRN

## 2015-11-27 MED ORDER — OXYCODONE-ACETAMINOPHEN 5-325 MG PO TABS: 1.0000 | ORAL_TABLET | ORAL | Status: AC | PRN

## 2015-11-27 NOTE — Discharge Summary (Signed)
Physician Discharge Summary  Patient ID: Derrick [name] MRN: [id number] DOB/AGE: 61-01-1956 61 y.o.  Admit date: 11/26/2015 Discharge date: 11/27/2015  Admission Diagnoses: post-lami spondylolisthesis   Discharge Diagnoses: same   Discharged Condition: good  Hospital Course: The patient was admitted on 11/26/2015 and taken to the operating room where the patient underwent TLIF L4-5. The patient tolerated the procedure well and was taken to the recovery room and then to the floor in stable condition. The hospital course was routine. There were no complications. The wound remained clean dry and intact. Pt had appropriate bac soreness. No complaints of leg pain or new N/T/W. The patient remained afebrile with stable vital signs, and tolerated a regular diet. The patient continued to increase activities, and pain was well controlled with oral pain medications.   Consults: None  Significant Diagnostic Studies:  Results for orders placed or performed during the hospital encounter of 11/26/15  Glucose, capillary  Result Value Ref Range   Glucose-Capillary 168 (H) 65 - 99 mg/dL   Comment 1 Notify RN   Glucose, capillary  Result Value Ref Range   Glucose-Capillary 262 (H) 65 - 99 mg/dL   Comment 1 Notify RN    Comment 2 Document in Chart   Glucose, capillary  Result Value Ref Range   Glucose-Capillary 352 (H) 65 - 99 mg/dL  Glucose, capillary  Result Value Ref Range   Glucose-Capillary 195 (H) 65 - 99 mg/dL    Chest 2 View  4/0/9811  CLINICAL DATA:  Back surgery. EXAM: CHEST  2 VIEW COMPARISON:  05/28/2012. FINDINGS: Mediastinum hilar structures normal. Lungs are clear of acute infiltrates. No pleural effusion or pneumothorax. Heart size normal. Degenerative changes thoracic spine . IMPRESSION: No acute cardiopulmonary disease. Electronically Signed   By: Maisie Fus  Register   On: 11/19/2015 14:46   Dg Lumbar Spine 2-3 Views  11/26/2015  CLINICAL DATA:  L4-5 MAS TLIF. EXAM: DG C-ARM  61-120 MIN; LUMBAR SPINE - 2-3 VIEW COMPARISON:  05/27/2015 lumbar spine MRI. FINDINGS: Fluoroscopy time 1 minutes 1 second. Two nondiagnostic spot fluoroscopic intraoperative radiographs of the lower lumbar spine were provided, which demonstrate bilateral posterior lumbar spine fusion at L4-5 with bone cage in the L4-5 disc space. IMPRESSION: Intraoperative fluoroscopic guidance for L4-5 MAS TLIF. Electronically Signed   By: Delbert Phenix M.D.   On: 11/26/2015 15:12   Dg C-arm 61-120 Min  11/26/2015  CLINICAL DATA:  L4-5 MAS TLIF. EXAM: DG C-ARM 61-120 MIN; LUMBAR SPINE - 2-3 VIEW COMPARISON:  05/27/2015 lumbar spine MRI. FINDINGS: Fluoroscopy time 1 minutes 1 second. Two nondiagnostic spot fluoroscopic intraoperative radiographs of the lower lumbar spine were provided, which demonstrate bilateral posterior lumbar spine fusion at L4-5 with bone cage in the L4-5 disc space. IMPRESSION: Intraoperative fluoroscopic guidance for L4-5 MAS TLIF. Electronically Signed   By: Delbert Phenix M.D.   On: 11/26/2015 15:12    Antibiotics:  Anti-infectives    Start     Dose/Rate Route Frequency Ordered Stop   11/26/15 2100  ceFAZolin (ANCEF) IVPB 1 g/50 mL premix     1 g 100 mL/hr over 30 Minutes Intravenous Every 8 hours 11/26/15 1842 11/27/15 0659   11/26/15 1512  vancomycin (VANCOCIN) powder  Status:  Discontinued       As needed 11/26/15 1512 11/26/15 1522   11/26/15 1317  vancomycin (VANCOCIN) 1000 MG powder    Comments:  Dorinda Hill   : cabinet override      11/26/15 1317 11/27/15 0129   11/26/15  1311  bacitracin 50,000 Units in sodium chloride irrigation 0.9 % 500 mL irrigation  Status:  Discontinued       As needed 11/26/15 1311 11/26/15 1522   11/26/15 1122  ceFAZolin (ANCEF) IVPB 2 g/50 mL premix     2 g 100 mL/hr over 30 Minutes Intravenous On call to O.R. 11/26/15 1122 11/26/15 1223      Discharge Exam: Blood pressure 114/57, pulse 83, temperature 98.9 F (37.2 C), temperature source Oral,  resp. rate 16, height 6\' 2"  (1.88 m), weight 82.101 kg (181 lb), SpO2 94 %. Neurologic: Grossly normal Dressing dry  Discharge Medications:     Medication List    TAKE these medications        clonazePAM 1 MG tablet  Commonly known as:  KLONOPIN  Take 1 mg by mouth 3 (three) times daily as needed for anxiety.     glyBURIDE 5 MG tablet  Commonly known as:  DIABETA  Take 5-10 mg by mouth 2 (two) times daily with a meal. Takes 1 in the morning and 2 in the evening.     metFORMIN 500 MG tablet  Commonly known as:  GLUCOPHAGE  Take 500-1,000 mg by mouth 2 (two) times daily with a meal.     methocarbamol 500 MG tablet  Commonly known as:  ROBAXIN  Take 1 tablet (500 mg total) by mouth every 6 (six) hours as needed for muscle spasms.     oxyCODONE-acetaminophen 5-325 MG tablet  Commonly known as:  ROXICET  Take 1-2 tablets by mouth every 4 (four) hours as needed for severe pain.     tamsulosin 0.4 MG Caps capsule  Commonly known as:  FLOMAX  Take 1 capsule (0.4 mg total) by mouth daily.        Disposition: home   Final Dx: TLIF L4-5      Discharge Instructions     Remove dressing in 72 hours    Complete by:  As directed      Call MD for:  difficulty breathing, headache or visual disturbances    Complete by:  As directed      Call MD for:  persistant nausea and vomiting    Complete by:  As directed      Call MD for:  redness, tenderness, or signs of infection (pain, swelling, redness, odor or green/yellow discharge around incision site)    Complete by:  As directed      Call MD for:  severe uncontrolled pain    Complete by:  As directed      Call MD for:  temperature >100.4    Complete by:  As directed      Diet - low sodium heart healthy    Complete by:  As directed      Discharge instructions    Complete by:  As directed   No driving, may shower, no bending or twisting, no lifting     Increase activity slowly    Complete by:  As directed             Follow-up Information    Follow up with JONES,DAVID S, MD. Schedule an appointment as soon as possible for a visit in 2 weeks.   Specialty:  Neurosurgery   Contact information:   [address] [place] [address] [phone]        Signed: Tia AlertJONES,DAVID S 11/27/2015, 9:33 AM

## 2015-11-27 NOTE — Care Management Note (Addendum)
Case Management Note  Patient Details  Name: Derrick [name] MRN: [id number] Date of Birth: 01/23/1956  Subjective/Objective:                    Action/Plan: Plan is to discharge patient home today with self care with his wife. Patient has an order for wheelchair. CM left a message with Jonne PlyCindy Felix [phone]) his case manager for [employer] informing her of discharge and the need for a walker that patient wants delivered to his home. CM will continue to follow for further d/c needs.   Addendum: 11:35 am: CM spoke with Jonne Plyindy Felix and she is to arrange for patient to receive a walker to his home.   Expected Discharge Date:                  Expected Discharge Plan:  Home/Self Care  In-House Referral:     Discharge planning Services  CM Consult  Post Acute Care Choice:    Choice offered to:     [name]:    [name]:     HH Arranged:    HH Agency:     Status of Service:  Completed, signed off  Medicare Important Message Given:    Date Medicare IM Given:    Medicare IM give by:    Date Additional Medicare IM Given:    Additional Medicare Important Message give by:     If discussed at Long Length of Stay Meetings, dates discussed:    Additional Comments:  Kermit BaloKelli F Britania Shreeve, RN 11/27/2015, 11:41 AM

## 2015-11-27 NOTE — Progress Notes (Signed)
Occupational Therapy Evaluation Patient Details Name: Derrick [name] MRN: [id number] DOB: 07/22/1956 Today's Date: 11/27/2015    History of Present Illness 60 y.o. male s/p decompressive lumbar laminectomy L4-5, transforaminal lumbar inetrbody fusion L4-5, and posterior fixation L4-5. PMH significant for DM type II, spinal stenosis, anxiety, depression, BPH, and inguinal hernia.   Clinical Impression   Occupational Therapy evaluation was limited by pt's back brace not being delivered to pt's room. Therapist called [employer] and was informed [facility] - RN informed to track down brace. Reviewed back precautions, sleep positioning, compensatory strategies for dressing, and AE for pericare with pt and wife. Pt will benefit from continued acute OT to increase independence and safety with ADLs to allow for safe discharge home with assistance from wife. No OT follow up or DME recommended at this time - may change after 2nd session this afternoon.    Follow Up Recommendations  No OT follow up;Supervision - Intermittent    Equipment Recommendations  None recommended by OT    Recommendations for Other Services       Precautions / Restrictions Precautions Precautions: Fall;Back Precaution Booklet Issued: Yes (comment) Precaution Comments: Reviewed back precautions with pt and wife. Pt has had previous back surgery and remembered some from that. Required Braces or Orthoses: Spinal Brace Spinal Brace: Lumbar corset;Applied in sitting position Restrictions Weight Bearing Restrictions: No      Mobility Bed Mobility Overal bed mobility: Needs Assistance Bed Mobility: Rolling;Sidelying to Sit;Sit to Sidelying Rolling: Supervision Sidelying to sit: Min guard     Sit to sidelying: Min guard General bed mobility comments: HOB flat, no use of bedrails to simulate home environment.  Transfers                 General transfer comment: Unable to  attempt transfers due to back brace not delivered to room yet    Balance Overall balance assessment: Needs assistance Sitting-balance support: No upper extremity supported;Feet supported Sitting balance-Leahy Scale: Fair                                      ADL Overall ADL's : Needs assistance/impaired         Upper Body Bathing: Supervision/ safety;Sitting   Lower Body Bathing: Supervison/ safety;With adaptive equipment;With caregiver independent assisting;Sitting/lateral leans Lower Body Bathing Details (indicate cue type and reason): able to cross ankle-over-knee Upper Body Dressing : Supervision/safety;Sitting   Lower Body Dressing: Supervision/safety;With caregiver independent assisting;Sitting/lateral leans Lower Body Dressing Details (indicate cue type and reason): able to cross ankle-over-knee               General ADL Comments: Wife present for session. Reviewed back precautions, positioning for sleep, brace wear protocol, using 3in1 over toilet and in shower initially, and compensatory strategies for dressing. Unable to mobilize past sitting EOB due to back brace not being delivered to pt's room yet.     Vision Vision Assessment?: Yes Eye Alignment: Within Functional Limits Ocular Range of Motion: Within Functional Limits Alignment/Gaze Preference: Within Defined Limits Tracking/Visual Pursuits: Able to track stimulus in all quads without difficulty Saccades: Within functional limits Convergence: Within functional limits Visual Fields: No apparent deficits Additional Comments: ~20 degree R peripheral and superior field cuts (pt unsure if this is baseline)   Perception     Praxis      Pertinent Vitals/Pain Pain Assessment: 0-10 Pain  Score: 7  Pain Location: back Pain Descriptors / Indicators: Aching;Sore Pain Intervention(s): Limited activity within patient's tolerance;Monitored during session;Repositioned     Hand Dominance Right    Extremity/Trunk Assessment Upper Extremity Assessment Upper Extremity Assessment: Overall WFL for tasks assessed   Lower Extremity Assessment Lower Extremity Assessment: Defer to PT evaluation   Cervical / Trunk Assessment Cervical / Trunk Assessment: Normal   Communication Communication Communication: No difficulties   Cognition Arousal/Alertness: Awake/alert Behavior During Therapy: Flat affect Overall Cognitive Status: Within Functional Limits for tasks assessed                     General Comments       Exercises       Shoulder Instructions      Home Living Family/patient expects to be discharged to:: Private residence Living Arrangements: Spouse/significant other Available Help at Discharge: Family;Available 24 hours/day (Until next Tuesday) Type of Home: House Home Access: Level entry     Home Layout: Two level;Bed/bath upstairs Alternate Level Stairs-Number of Steps: 13 Alternate Level Stairs-Rails: Right;Left;Can reach both Bathroom Shower/Tub: Walk-in shower;Door   [employer] Toilet: Standard     Home Equipment: Environmental consultant - 2 wheels;Bedside commode;Adaptive equipment;Shower seat - built in [employer] Equipment: Reacher;Sock aid;Long-handled shoe horn;Long-handled sponge Additional Comments: Wife will be going back to work in the afternoons starting next Tuesdau - pt will be alone for several hours. Pt plans to sleep in recliner in man cave on 1st floor.      Prior Functioning/Environment Level of Independence: Independent             OT Diagnosis: Acute pain   OT Problem List: Decreased strength;Decreased range of motion;Decreased activity tolerance;Impaired balance (sitting and/or standing);Decreased coordination;Decreased safety awareness;Decreased knowledge of use of DME or AE;Decreased knowledge of precautions;Pain   OT Treatment/Interventions: Self-care/ADL training;Therapeutic exercise;Energy conservation;DME and/or AE  instruction;Therapeutic activities;Patient/family education;Balance training    OT Goals(Current goals can be found in the care plan section) Acute Rehab OT Goals Patient Stated Goal: to go home OT Goal Formulation: With patient Time For Goal Achievement: 12/11/15 Potential to Achieve Goals: Good ADL Goals Pt Will Perform Grooming: with supervision;standing Pt Will Perform Lower Body Bathing: with supervision;sit to/from stand Pt Will Perform Lower Body Dressing: with supervision;sit to/from stand Pt Will Transfer to Toilet: with supervision;ambulating;bedside commode (BSC over toilet) Pt Will Perform Toileting - Clothing Manipulation and hygiene: with supervision;with adaptive equipment;sit to/from stand Pt Will Perform Tub/Shower Transfer: Shower transfer;with supervision;ambulating;3 in 1;rolling walker Additional ADL Goal #1: Pt will independently don/doff back brace to increase independence with ADLs. Additional ADL Goal #2: Pt will demonstrate adherence to 3/3 back precautions during ADLs and mobility.  OT Frequency: Min 2X/week   Barriers to D/C:            Co-evaluation              End of Session Nurse Communication: Mobility status;Precautions;Other (comment) (Pt needs back brace)  Activity Tolerance: Other (comment) (Evaluation limited by no back brace in room yet) Patient left: in bed;with call bell/phone within reach;with bed alarm set;with family/visitor present   Time: 1015-1040 OT Time Calculation (min): 25 min Charges:  OT General Charges $OT Visit: 1 Procedure OT Evaluation $OT Eval Moderate Complexity: 1 Procedure OT Treatments $Self Care/Home Management : 8-22 mins G-Codes:    Nils Pyle, OTR/L Pager: [phone] 11/27/2015, 11:33 AM

## 2015-11-27 NOTE — Progress Notes (Signed)
Patient is discharged from room 5M01 at this time. Alert and in stable condition. IV site d/c'd and instructions read to patient and wife with understanding verbalized. Left unit via wheelchair with all belongings and wife at side.

## 2015-11-28 LAB — POCT I-STAT 4, (NA,K, GLUC, HGB,HCT)
GLUCOSE: 120 mg/dL — AB (ref 65–99)
HEMATOCRIT: 44 % (ref 39.0–52.0)
HEMOGLOBIN: 15 g/dL (ref 13.0–17.0)
POTASSIUM: 4.7 mmol/L (ref 3.5–5.1)
Sodium: 138 mmol/L (ref 135–145)

## 2015-11-30 NOTE — Anesthesia Postprocedure Evaluation (Signed)
Anesthesia Post Note  Patient: Derrick MoreheadArtis [name]  Procedure(s) Performed: Procedure(s) (LRB): Lumbar four-five MAXIMUM ACCESS TRANSFORAMINAL LUMBAR INTERBODY FUSION  (N/A)  Patient location during evaluation: PACU Anesthesia Type: General Level of consciousness: awake and alert Pain management: pain level controlled Vital Signs Assessment: post-procedure vital signs reviewed and stable Respiratory status: spontaneous breathing, nonlabored ventilation, respiratory function stable and patient connected to nasal cannula oxygen Cardiovascular status: blood pressure returned to baseline and stable Postop Assessment: no signs of nausea or vomiting Anesthetic complications: no    Last Vitals:  Filed Vitals:   11/27/15 0952 11/27/15 1218  BP: 105/59   Pulse: 81   Temp: 36.9 C 36.8 C  Resp: 18     Last Pain:  Filed Vitals:   11/27/15 1218  PainSc: 4                  Cecile HearingStephen Edward Cliford Sequeira

## 2015-12-02 ENCOUNTER — Encounter (HOSPITAL_COMMUNITY): Payer: Self-pay | Admitting: Neurological Surgery

## 2017-01-03 IMAGING — RF DG LUMBAR SPINE 2-3V
1 series · 2 of 2 positions shown · non-contrast
Comparison: 05/27/2015 lumbar spine MRI.

CLINICAL DATA: L4-5 MAS TLIF.

EXAM:
DG C-ARM 61-120 MIN; LUMBAR SPINE - 2-3 VIEW

[Series 1: run · 2 of 2 slices shown]
[im 1/2]
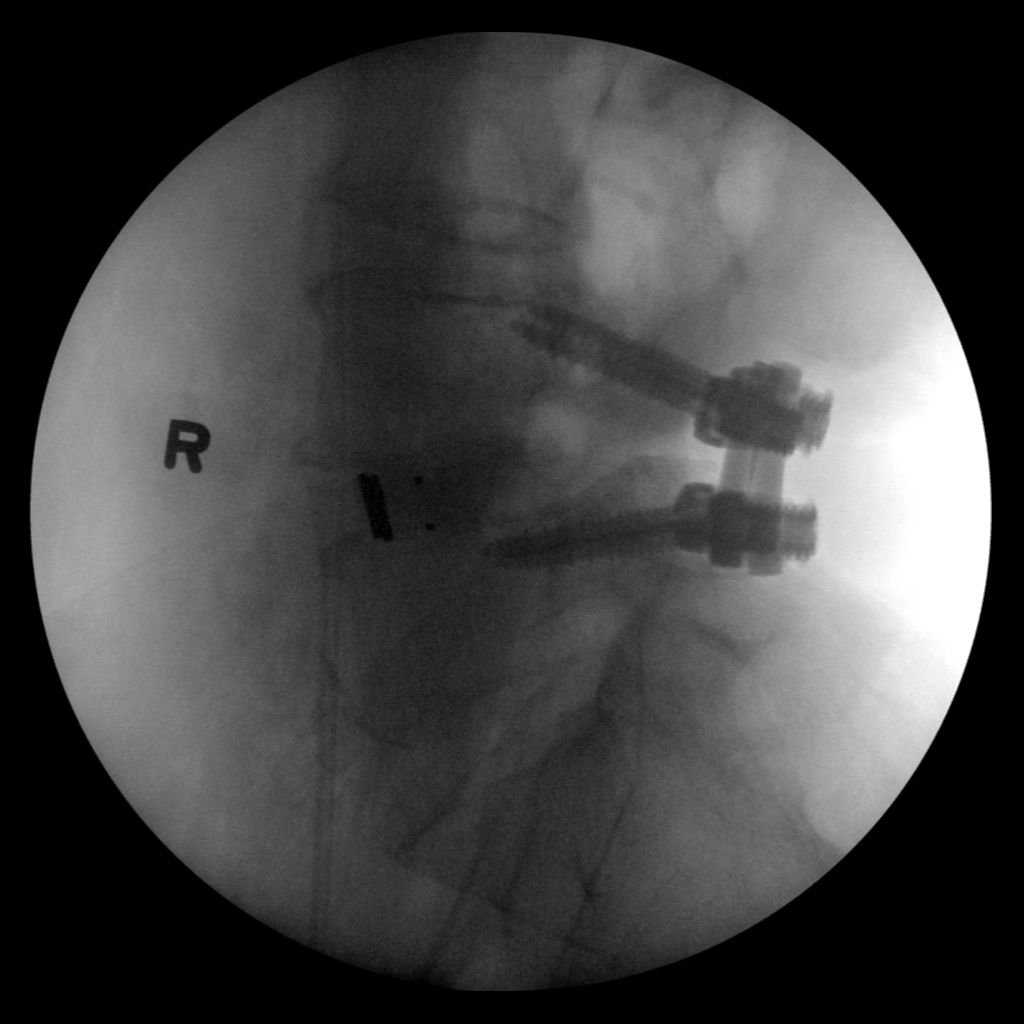
[im 2/2]
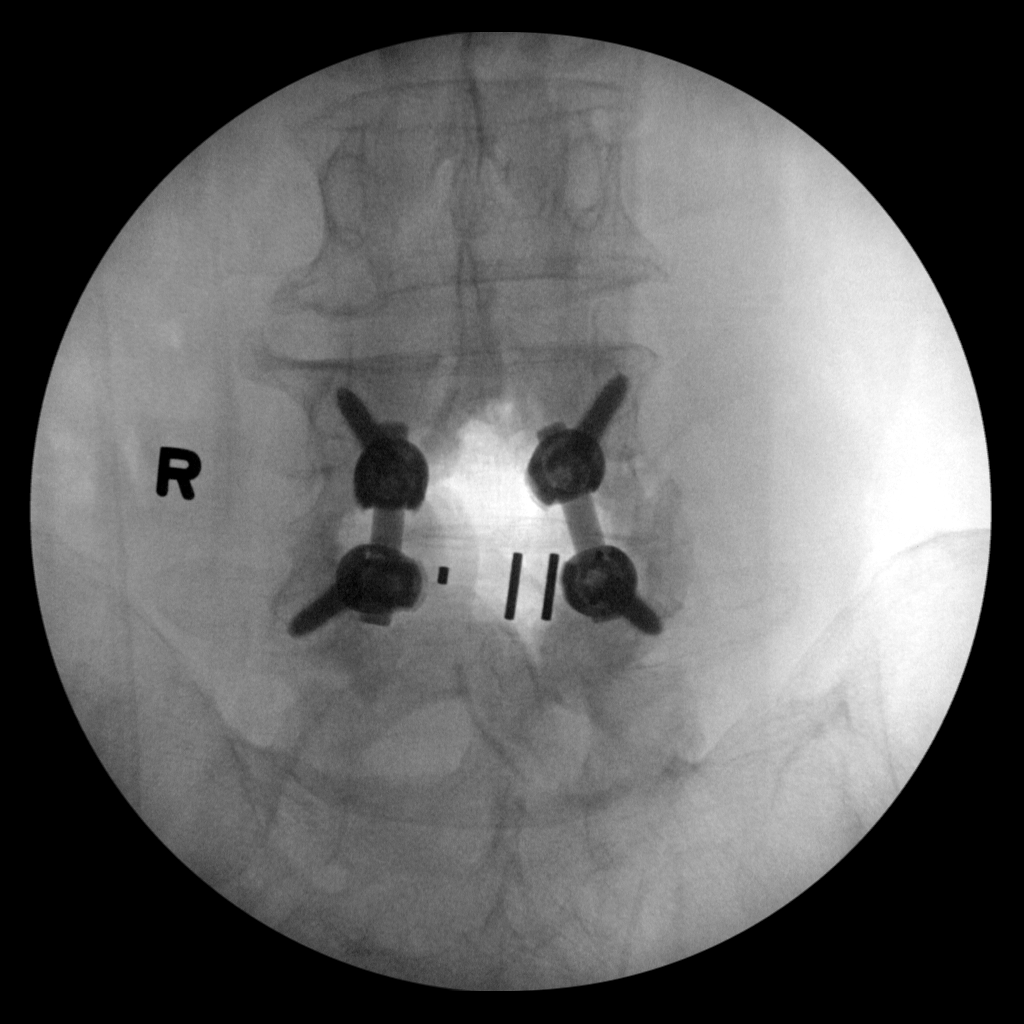

[2 of 2 positions shown; findings below may reference images not displayed]

FINDINGS: Fluoroscopy time 1 minutes 1 second. Two nondiagnostic spot
fluoroscopic intraoperative radiographs of the lower lumbar spine
were provided, which demonstrate bilateral posterior lumbar spine
fusion at L4-5 with bone cage in the L4-5 disc space.
IMPRESSION: Intraoperative fluoroscopic guidance for L4-5 MAS TLIF.

## 2019-12-30 ENCOUNTER — Ambulatory Visit: Payer: Self-pay

## 2020-06-14 DEATH — deceased
